# Patient Record
Sex: Male | Born: 1945 | Race: White | Hispanic: No | Marital: Married | Smoking: Never smoker
Health system: Southern US, Community
[De-identification: ages and names within clinical notes are randomized; demographics above are authoritative.]

## PROBLEM LIST (undated history)

## (undated) DIAGNOSIS — I8393 Asymptomatic varicose veins of bilateral lower extremities: Secondary | ICD-10-CM

## (undated) DIAGNOSIS — I2510 Atherosclerotic heart disease of native coronary artery without angina pectoris: Secondary | ICD-10-CM

## (undated) DIAGNOSIS — I441 Atrioventricular block, second degree: Secondary | ICD-10-CM

## (undated) DIAGNOSIS — I214 Non-ST elevation (NSTEMI) myocardial infarction: Secondary | ICD-10-CM

## (undated) DIAGNOSIS — F341 Dysthymic disorder: Secondary | ICD-10-CM

## (undated) HISTORY — PX: HERNIA REPAIR: SHX51

## (undated) HISTORY — DX: Dysthymic disorder: F34.1

---

## 1998-12-25 HISTORY — PX: OTHER SURGICAL HISTORY: SHX169

## 1999-06-15 ENCOUNTER — Encounter: Payer: Self-pay | Admitting: Vascular Surgery

## 1999-06-17 ENCOUNTER — Ambulatory Visit (HOSPITAL_COMMUNITY): Admission: RE | Admit: 1999-06-17 | Discharge: 1999-06-17 | Payer: Self-pay | Admitting: Vascular Surgery

## 1999-12-31 ENCOUNTER — Ambulatory Visit (HOSPITAL_COMMUNITY): Admission: RE | Admit: 1999-12-31 | Discharge: 1999-12-31 | Payer: Self-pay | Admitting: Family Medicine

## 1999-12-31 ENCOUNTER — Encounter: Payer: Self-pay | Admitting: Family Medicine

## 2013-09-02 DIAGNOSIS — H6590 Unspecified nonsuppurative otitis media, unspecified ear: Secondary | ICD-10-CM | POA: Diagnosis not present

## 2013-09-02 DIAGNOSIS — Z23 Encounter for immunization: Secondary | ICD-10-CM | POA: Diagnosis not present

## 2013-09-30 DIAGNOSIS — H109 Unspecified conjunctivitis: Secondary | ICD-10-CM | POA: Diagnosis not present

## 2013-09-30 DIAGNOSIS — B368 Other specified superficial mycoses: Secondary | ICD-10-CM | POA: Diagnosis not present

## 2013-11-27 DIAGNOSIS — B3784 Candidal otitis externa: Secondary | ICD-10-CM | POA: Diagnosis not present

## 2013-12-03 DIAGNOSIS — B3784 Candidal otitis externa: Secondary | ICD-10-CM | POA: Diagnosis not present

## 2014-01-14 DIAGNOSIS — B3784 Candidal otitis externa: Secondary | ICD-10-CM | POA: Diagnosis not present

## 2014-02-13 DIAGNOSIS — J111 Influenza due to unidentified influenza virus with other respiratory manifestations: Secondary | ICD-10-CM | POA: Diagnosis not present

## 2014-02-13 DIAGNOSIS — R030 Elevated blood-pressure reading, without diagnosis of hypertension: Secondary | ICD-10-CM | POA: Diagnosis not present

## 2014-05-27 DIAGNOSIS — I998 Other disorder of circulatory system: Secondary | ICD-10-CM | POA: Diagnosis not present

## 2014-06-08 DIAGNOSIS — Z0000 Encounter for general adult medical examination without abnormal findings: Secondary | ICD-10-CM | POA: Diagnosis not present

## 2014-06-08 DIAGNOSIS — Z125 Encounter for screening for malignant neoplasm of prostate: Secondary | ICD-10-CM | POA: Diagnosis not present

## 2014-06-08 DIAGNOSIS — Z131 Encounter for screening for diabetes mellitus: Secondary | ICD-10-CM | POA: Diagnosis not present

## 2014-07-02 ENCOUNTER — Other Ambulatory Visit: Payer: Self-pay | Admitting: Family Medicine

## 2014-07-02 DIAGNOSIS — Z0000 Encounter for general adult medical examination without abnormal findings: Secondary | ICD-10-CM

## 2014-08-05 ENCOUNTER — Encounter (INDEPENDENT_AMBULATORY_CARE_PROVIDER_SITE_OTHER): Payer: Self-pay

## 2014-08-05 ENCOUNTER — Ambulatory Visit
Admission: RE | Admit: 2014-08-05 | Discharge: 2014-08-05 | Disposition: A | Discharge status: Home / Self Care | Payer: Medicare Other | Source: Ambulatory Visit | Attending: Family Medicine | Admitting: Family Medicine

## 2014-08-05 DIAGNOSIS — Z0000 Encounter for general adult medical examination without abnormal findings: Secondary | ICD-10-CM

## 2014-08-05 DIAGNOSIS — I77819 Aortic ectasia, unspecified site: Secondary | ICD-10-CM | POA: Diagnosis not present

## 2014-08-05 DIAGNOSIS — Z136 Encounter for screening for cardiovascular disorders: Secondary | ICD-10-CM | POA: Diagnosis not present

## 2014-10-09 ENCOUNTER — Other Ambulatory Visit: Payer: Self-pay

## 2014-11-30 DIAGNOSIS — J0100 Acute maxillary sinusitis, unspecified: Secondary | ICD-10-CM | POA: Diagnosis not present

## 2015-02-04 DIAGNOSIS — F430 Acute stress reaction: Secondary | ICD-10-CM | POA: Diagnosis not present

## 2015-02-04 DIAGNOSIS — R21 Rash and other nonspecific skin eruption: Secondary | ICD-10-CM | POA: Diagnosis not present

## 2015-02-04 DIAGNOSIS — R0683 Snoring: Secondary | ICD-10-CM | POA: Diagnosis not present

## 2015-06-21 ENCOUNTER — Other Ambulatory Visit: Payer: Self-pay

## 2015-07-07 DIAGNOSIS — L820 Inflamed seborrheic keratosis: Secondary | ICD-10-CM | POA: Diagnosis not present

## 2015-07-07 DIAGNOSIS — N5201 Erectile dysfunction due to arterial insufficiency: Secondary | ICD-10-CM | POA: Diagnosis not present

## 2015-07-07 DIAGNOSIS — Z0001 Encounter for general adult medical examination with abnormal findings: Secondary | ICD-10-CM | POA: Diagnosis not present

## 2015-07-07 DIAGNOSIS — Z125 Encounter for screening for malignant neoplasm of prostate: Secondary | ICD-10-CM | POA: Diagnosis not present

## 2015-07-07 DIAGNOSIS — Z23 Encounter for immunization: Secondary | ICD-10-CM | POA: Diagnosis not present

## 2015-07-07 DIAGNOSIS — I868 Varicose veins of other specified sites: Secondary | ICD-10-CM | POA: Diagnosis not present

## 2015-07-07 DIAGNOSIS — D485 Neoplasm of uncertain behavior of skin: Secondary | ICD-10-CM | POA: Diagnosis not present

## 2015-07-07 DIAGNOSIS — Z131 Encounter for screening for diabetes mellitus: Secondary | ICD-10-CM | POA: Diagnosis not present

## 2015-07-07 DIAGNOSIS — F430 Acute stress reaction: Secondary | ICD-10-CM | POA: Diagnosis not present

## 2015-09-20 DIAGNOSIS — J310 Chronic rhinitis: Secondary | ICD-10-CM | POA: Diagnosis not present

## 2015-09-20 DIAGNOSIS — J029 Acute pharyngitis, unspecified: Secondary | ICD-10-CM | POA: Diagnosis not present

## 2015-09-20 DIAGNOSIS — R05 Cough: Secondary | ICD-10-CM | POA: Diagnosis not present

## 2015-12-15 DIAGNOSIS — Z23 Encounter for immunization: Secondary | ICD-10-CM | POA: Diagnosis not present

## 2016-02-11 IMAGING — US US AORTA SCREENING (MEDICARE)
1 series · 13 of 13 positions shown · non-contrast
Comparison: None.

CLINICAL DATA: Medicare screening exam for abdominal aortic
aneurysm.

EXAM:
ABDOMINAL AORTA SCREENING ULTRASOUND
TECHNIQUE: Ultrasound examination of the abdominal aorta was performed as a
screening evaluation for abdominal aortic aneurysm.

[Series 1: us aorta screening (medicare) · 0.28mm/px · 13 of 13 slices shown]
[im 1/13]
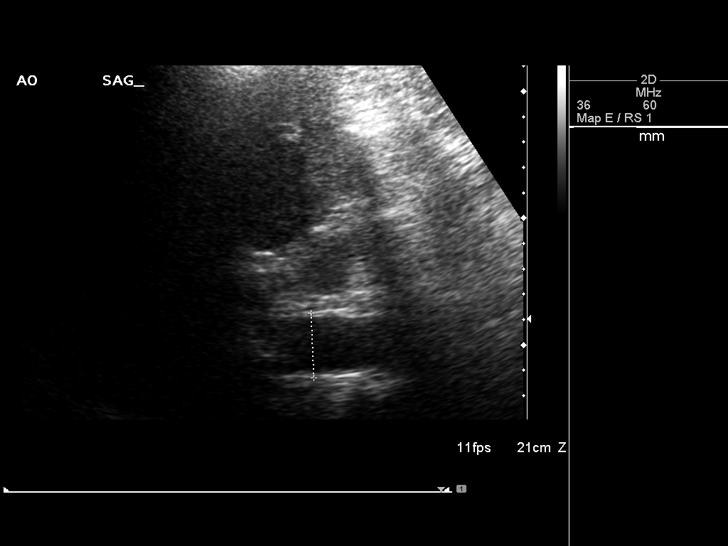
[im 2/13]
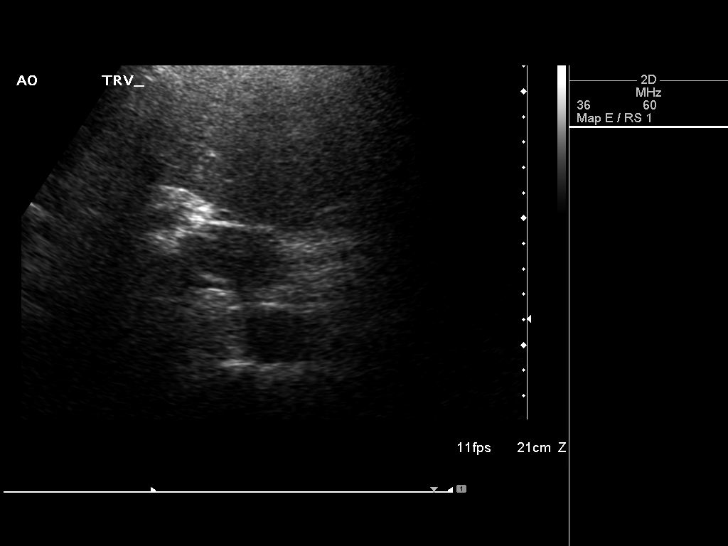
[im 3/13]
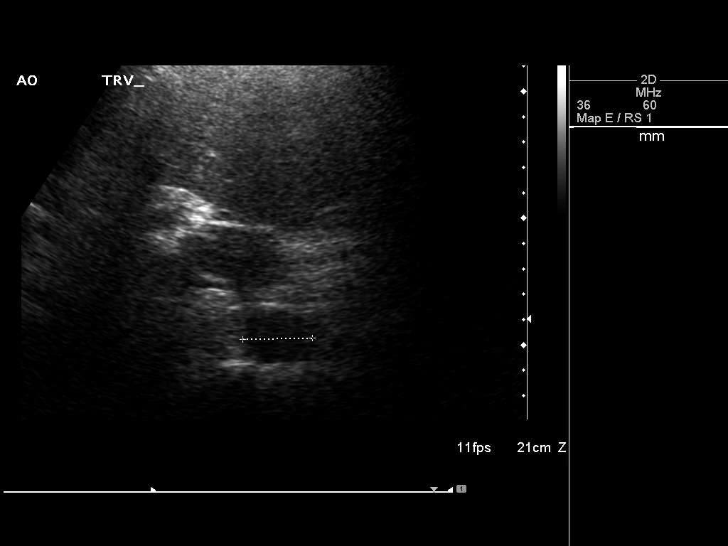
[im 4/13]
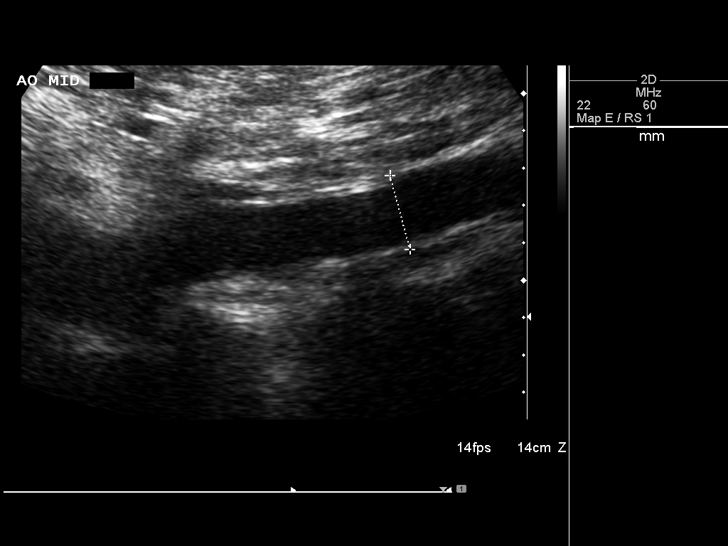
[im 5/13]
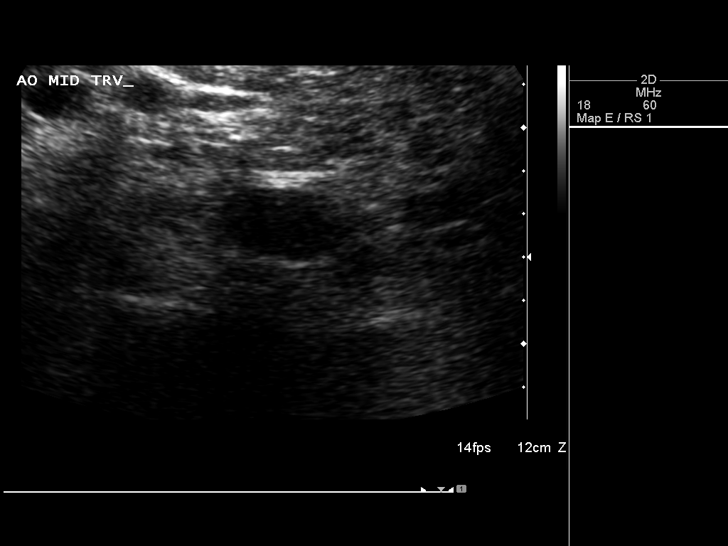
[im 6/13]
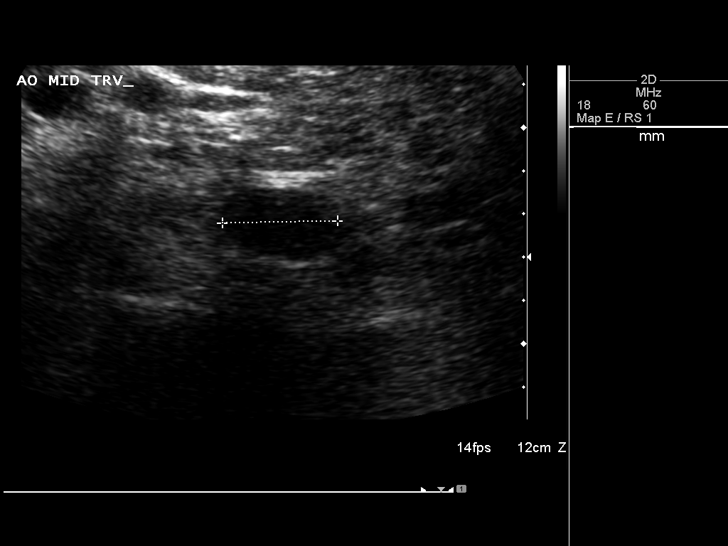
[im 7/13]
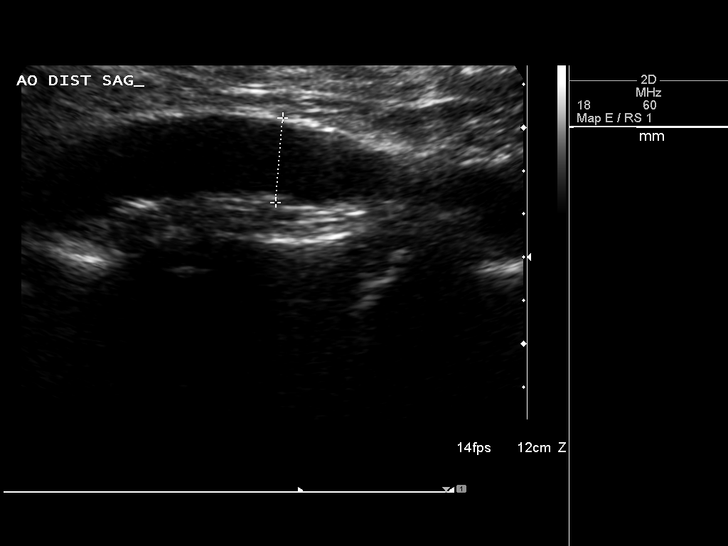
[im 8/13]
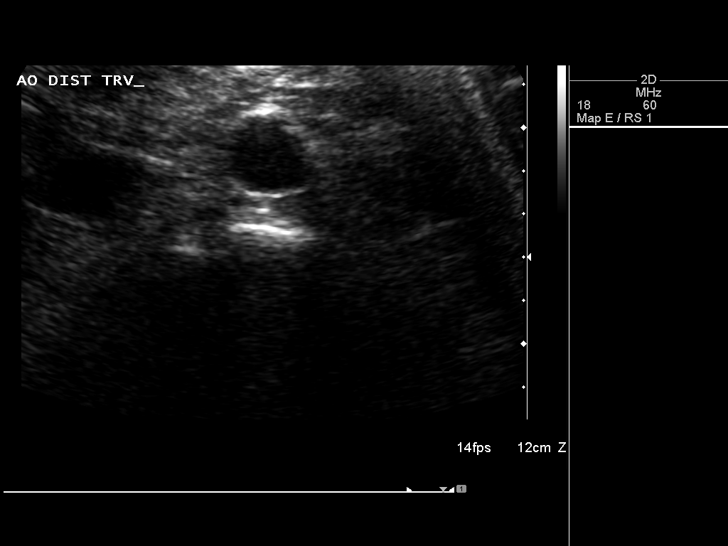
[im 9/13]
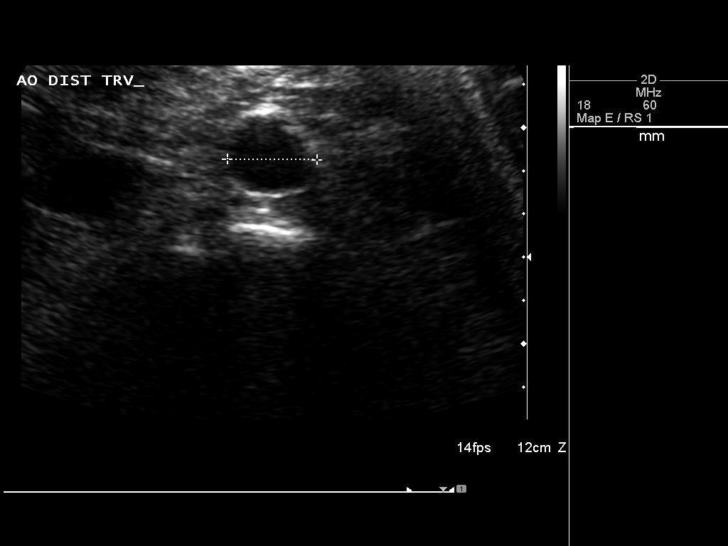
[im 10/13]
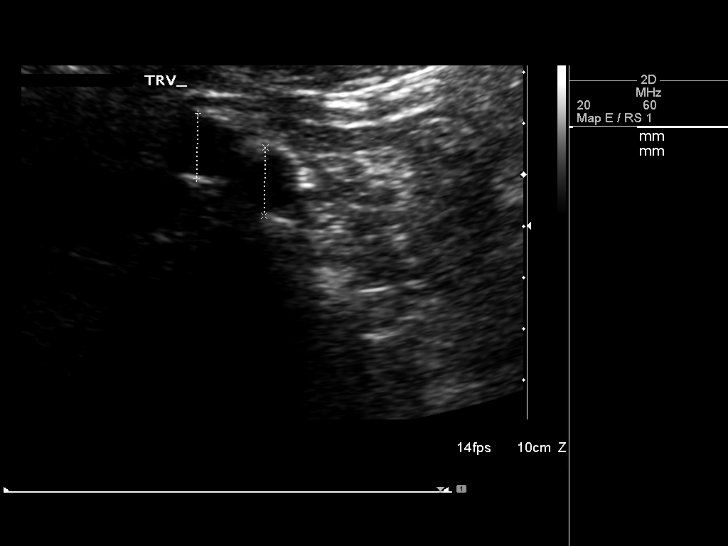
[im 11/13]
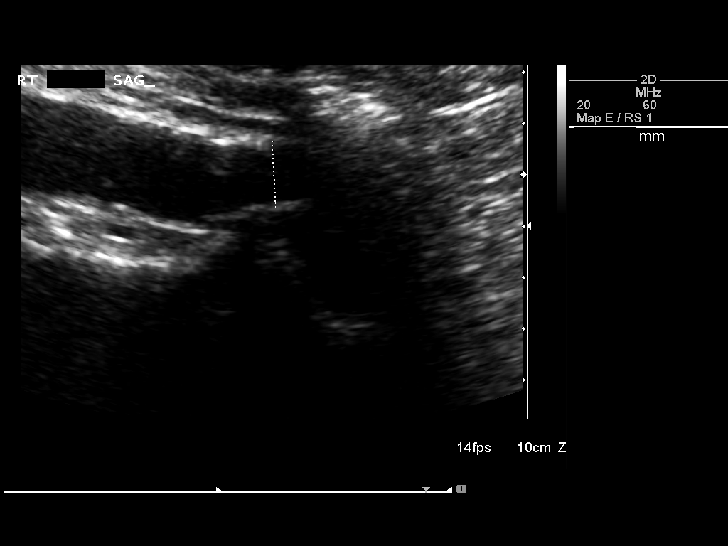
[im 12/13]
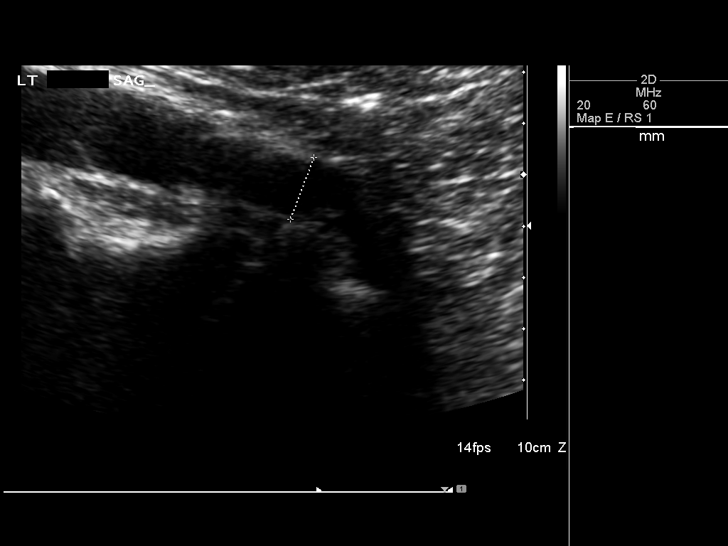
[im 13/13]
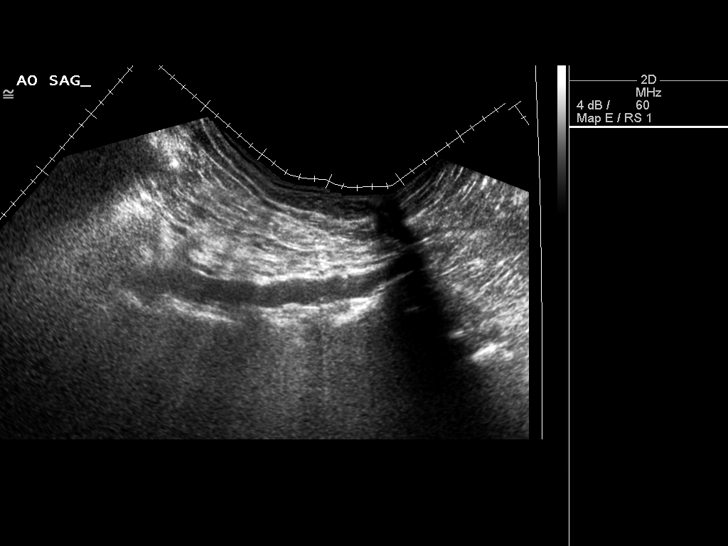

[13 of 13 positions shown; findings below may reference images not displayed]

FINDINGS: Abdominal Aorta

No aneurysm identified.

Maximum AP

Diameter:  2.6 cm

Maximum TRV

Diameter: 2.8 cm
IMPRESSION: Ectatic abdominal aorta at risk for aneurysm development. Recommend
follow up by US in 5 years. This recommendation follows ACR
consensus guidelines: White Paper of the ACR Incidental Findings
Committee II on Vascular Findings. [HOSPITAL] 4883;

## 2016-07-25 DIAGNOSIS — N529 Male erectile dysfunction, unspecified: Secondary | ICD-10-CM | POA: Diagnosis not present

## 2016-07-25 DIAGNOSIS — Z0000 Encounter for general adult medical examination without abnormal findings: Secondary | ICD-10-CM | POA: Diagnosis not present

## 2016-07-25 DIAGNOSIS — I8393 Asymptomatic varicose veins of bilateral lower extremities: Secondary | ICD-10-CM | POA: Diagnosis not present

## 2016-07-25 DIAGNOSIS — Z125 Encounter for screening for malignant neoplasm of prostate: Secondary | ICD-10-CM | POA: Diagnosis not present

## 2016-07-25 DIAGNOSIS — Z131 Encounter for screening for diabetes mellitus: Secondary | ICD-10-CM | POA: Diagnosis not present

## 2016-07-25 DIAGNOSIS — I499 Cardiac arrhythmia, unspecified: Secondary | ICD-10-CM | POA: Diagnosis not present

## 2016-08-18 DIAGNOSIS — I83813 Varicose veins of bilateral lower extremities with pain: Secondary | ICD-10-CM | POA: Diagnosis not present

## 2016-08-18 DIAGNOSIS — M7989 Other specified soft tissue disorders: Secondary | ICD-10-CM | POA: Diagnosis not present

## 2016-08-18 DIAGNOSIS — M79606 Pain in leg, unspecified: Secondary | ICD-10-CM | POA: Diagnosis not present

## 2016-08-18 DIAGNOSIS — R252 Cramp and spasm: Secondary | ICD-10-CM | POA: Diagnosis not present

## 2016-09-12 ENCOUNTER — Other Ambulatory Visit: Payer: Self-pay | Admitting: *Deleted

## 2016-09-12 DIAGNOSIS — I83893 Varicose veins of bilateral lower extremities with other complications: Secondary | ICD-10-CM

## 2016-09-18 ENCOUNTER — Encounter: Payer: Self-pay | Admitting: Cardiology

## 2016-09-18 ENCOUNTER — Ambulatory Visit (INDEPENDENT_AMBULATORY_CARE_PROVIDER_SITE_OTHER): Payer: Medicare Other | Admitting: Cardiology

## 2016-09-18 VITALS — BP 120/80 | HR 90 | Ht 74.0 in | Wt 233.0 lb

## 2016-09-18 DIAGNOSIS — I868 Varicose veins of other specified sites: Secondary | ICD-10-CM | POA: Diagnosis not present

## 2016-09-18 DIAGNOSIS — I441 Atrioventricular block, second degree: Secondary | ICD-10-CM | POA: Diagnosis not present

## 2016-09-18 DIAGNOSIS — R9431 Abnormal electrocardiogram [ECG] [EKG]: Secondary | ICD-10-CM

## 2016-09-18 DIAGNOSIS — I4510 Unspecified right bundle-branch block: Secondary | ICD-10-CM | POA: Diagnosis not present

## 2016-09-18 DIAGNOSIS — I8390 Asymptomatic varicose veins of unspecified lower extremity: Secondary | ICD-10-CM

## 2016-09-18 NOTE — Progress Notes (Signed)
Cardiology Office Note    Date:  09/18/2016   ID:  Matthew Nunez, DOB 1946-06-11, MRN [id number]  PCP:  Harle Battiest, MD  Cardiologist:   Candee Furbish, MD     History of Present Illness:  Matthew Nunez is a 70 y.o. male here for evaluation of second-degree heart block, irregular rhythm at the request of Dr. Melinda Crutch. He had an EKG performed on 07/25/16 as part of the Medicare physical which showed second-degree heart block. He is not describing any particular symptoms with this, no shortness of breath, no chest pain, no orthopnea, no PND.  In the past, he has had leg vein stripping. Has Pro long leg swelling left leg. Varicose veins will be examined on 10/08/16 by Dr. Donnetta Hutching. He previously seen somebody in [place] where his daughter is currently residing.  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 Enjoying this.    Past Medical History:  Diagnosis Date  . Dysthymia     Past Surgical History:  Procedure Laterality Date  . HERNIA REPAIR    . VERICOSE VEIN SURGERY  2000    Current Medications: Outpatient Medications Prior to Visit  Medication Sig Dispense Refill  . aspirin EC 81 MG tablet Take 81 mg by mouth daily.    Marland Kitchen SILDENAFIL CITRATE PO TAKE 2-5 TABLETS BY MOUTH  ONCE A DAY     No facility-administered medications prior to visit.      Allergies:   Review of patient's allergies indicates no known allergies.   Social History   Social History  . Marital status: Married    Spouse name: N/A  . Number of children: N/A  . Years of education: N/A   Social History Main Topics  . Smoking status: Never Smoker  . Smokeless tobacco: Never Used  . Alcohol use Yes     Comment: 2 DRINKS A WEEK  . Drug use: No  . Sexual activity: Yes     Comment: MARRIED   Other Topics Concern  . None   Social History Narrative  . None     Family History:  The patient's family history includes Alzheimer's disease in his father; Cirrhosis in his  brother; Healthy in his sister.   ROS:   Please see the history of present illness.    ROS All other systems reviewed and are negative.   PHYSICAL EXAM:   VS:  BP 120/80   Pulse 90   Ht 6\' 2"  (1.88 m)   Wt 233 lb (105.7 kg)   BMI 29.92 kg/m    GEN: Well nourished, well developed, in no acute distress  HEENT: normal  Neck: no JVD, carotid bruits, or masses Cardiac: RRR; no murmurs, rubs, or gallops,no edema  Respiratory:  clear to auscultation bilaterally, normal work of breathing GI: soft, nontender, nondistended, + BS MS: no deformity or atrophy  Skin: warm and dry, no rash Neuro:  Alert and Oriented x 3, Strength and sensation are intact Psych: euthymic mood, full affect  Wt Readings from Last 3 Encounters:  09/18/16 233 lb (105.7 kg)      Studies/Labs Reviewed:   EKG:  07/25/16-prior EKG shows sinus rhythm, right bundle branch block, prolongation of PR interval with subsequent drop consistent with second-degree heart block type I, Wenkebach.   Recent Labs: No results found for requested labs within last 8760 hours.   Lipid Panel No results found for: CHOL, TRIG, HDL, CHOLHDL, VLDL, LDLCALC, LDLDIRECT  Additional studies/ records that were  reviewed today include:  Prior records, labs, office notes, EKGs reviewed.    ASSESSMENT:    1. Abnormal EKG   2. Right bundle branch block   3. Second-degree heart block   4. 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  - Avoid beta blockers and calcium channel blockers.  - No high-risk symptoms such as syncope.  - Explained the physiology.  - This lesion does not require pacemaker.  - If symptoms of shortness of breath, lethargy, syncope, significant bradycardia occur, he may require pacemaker in the future.  - We will keep an eye on him. See him in one year.  Right bundle branch block  - Fibrosis.  - Checking echocardiogram.  -  Conduction abnormalities noted as above. No symptoms.  Varicose veins  - Hereditary. Previously seen Dr. at [facility].  - Has a consultation with Dr. Donnetta Hutching.    Medication Adjustments/Labs and Tests Ordered: Current medicines are reviewed at length with the patient today.  Concerns regarding medicines are outlined above.  Medication changes, Labs and Tests ordered today are listed in the Patient Instructions below. Patient Instructions  Medication Instructions:  The current medical regimen is effective;  continue present plan and medications.  Testing/Procedures: Your physician has requested that you have an echocardiogram. Echocardiography is a painless test that uses sound waves to create images of your heart. It provides your doctor with information about the size and shape of your heart and how well your heart's chambers and valves are working. This procedure takes approximately one hour. There are no restrictions for this procedure.  Follow-Up: Follow up in 1 year with Dr. Marlou Porch.  You will receive a letter in the mail 2 months before you are due.  Please call us when you receive this letter to schedule your follow up appointment.  If you need a refill on your cardiac medications before your next appointment, please call your pharmacy.  Thank you for choosing [facility]!!        Signed, Candee Furbish, MD  09/18/2016 10:34 AM    [facility] [address], [place], [place]  [address] Phone: [phone]; Fax: [phone]

## 2016-09-18 NOTE — Patient Instructions (Signed)

## 2016-09-19 ENCOUNTER — Ambulatory Visit (HOSPITAL_COMMUNITY): Payer: Medicare Other | Attending: Cardiology

## 2016-09-19 ENCOUNTER — Encounter (INDEPENDENT_AMBULATORY_CARE_PROVIDER_SITE_OTHER): Payer: Self-pay

## 2016-09-19 ENCOUNTER — Other Ambulatory Visit: Payer: Self-pay

## 2016-09-19 DIAGNOSIS — I517 Cardiomegaly: Secondary | ICD-10-CM | POA: Insufficient documentation

## 2016-09-19 DIAGNOSIS — I509 Heart failure, unspecified: Secondary | ICD-10-CM | POA: Diagnosis not present

## 2016-09-19 DIAGNOSIS — R9431 Abnormal electrocardiogram [ECG] [EKG]: Secondary | ICD-10-CM | POA: Diagnosis not present

## 2016-09-19 DIAGNOSIS — I340 Nonrheumatic mitral (valve) insufficiency: Secondary | ICD-10-CM | POA: Diagnosis not present

## 2016-10-05 ENCOUNTER — Encounter: Payer: Self-pay | Admitting: Vascular Surgery

## 2016-10-11 ENCOUNTER — Encounter: Payer: Self-pay | Admitting: Vascular Surgery

## 2016-10-11 ENCOUNTER — Ambulatory Visit (INDEPENDENT_AMBULATORY_CARE_PROVIDER_SITE_OTHER): Payer: Medicare Other | Admitting: Vascular Surgery

## 2016-10-11 ENCOUNTER — Ambulatory Visit (HOSPITAL_COMMUNITY)
Admission: RE | Admit: 2016-10-11 | Discharge: 2016-10-11 | Disposition: A | Discharge status: Home / Self Care | Payer: Medicare Other | Source: Ambulatory Visit | Attending: Vascular Surgery | Admitting: Vascular Surgery

## 2016-10-11 VITALS — BP 137/83 | HR 90 | Temp 98.4°F | Resp 18 | Ht 74.0 in | Wt 230.0 lb

## 2016-10-11 DIAGNOSIS — I82812 Embolism and thrombosis of superficial veins of left lower extremities: Secondary | ICD-10-CM | POA: Insufficient documentation

## 2016-10-11 DIAGNOSIS — I83893 Varicose veins of bilateral lower extremities with other complications: Secondary | ICD-10-CM

## 2016-10-11 DIAGNOSIS — R609 Edema, unspecified: Secondary | ICD-10-CM | POA: Diagnosis present

## 2016-10-11 NOTE — Progress Notes (Signed)
Vascular and Vein Specialist of [place]  Patient name: Matthew Nunez MRN: [id number] DOB: 04-Apr-1946 Sex: male  REASON FOR CONSULT: Evaluation of bilateral lower extremity venous pathology  HPI: Matthew Nunez is a 70 y.o. male, who is in today for evaluation of bilateral lower extremity venous pathology. He is well known to me from ligation stripping of his left great saphenous vein 17 years ago. He is done well since that time. He did have a recurrence of varicosities throughout the medial aspect of his thigh extending into his calf on the left. His had slowly progressed over time. He noted approximately 2 months ago that these became quite firm and mildly tender. He does report some discomfort associated with these large varices prior to his thromboses. He does have some swelling and mild discomfort in his right leg.  Past Medical History:  Diagnosis Date  . Dysthymia     Family History  Problem Relation Age of Onset  . Alzheimer's disease Father   . Healthy Sister   . Cirrhosis Brother     SOCIAL HISTORY: Social History   Social History  . Marital status: Married    Spouse name: N/A  . Number of children: N/A  . Years of education: N/A   Occupational History  . Not on file.   Social History Main Topics  . Smoking status: Never Smoker  . Smokeless tobacco: Never Used  . Alcohol use Yes     Comment: 2 DRINKS A WEEK  . Drug use: No  . Sexual activity: Yes     Comment: MARRIED   Other Topics Concern  . Not on file   Social History Narrative  . No narrative on file    No Known Allergies  Current Outpatient Prescriptions  Medication Sig Dispense Refill  . aspirin EC 81 MG tablet Take 81 mg by mouth daily.    Marland Kitchen SILDENAFIL CITRATE PO TAKE 2-5 TABLETS BY MOUTH  ONCE A DAY    . amoxicillin (AMOXIL) 500 MG capsule     . HYDROcodone-acetaminophen (NORCO) 10-325 MG tablet      No current facility-administered  medications for this visit.     REVIEW OF SYSTEMS:  [X]  denotes positive finding, [ ]  denotes negative finding Cardiac  Comments:  Chest pain or chest pressure:    Shortness of breath upon exertion:    Short of breath when lying flat:    Irregular heart rhythm:        Vascular    Pain in calf, thigh, or hip brought on by ambulation:    Pain in feet at night that wakes you up from your sleep:     Blood clot in your veins:    Leg swelling:  x       Pulmonary    Oxygen at home:    Productive cough:     Wheezing:         Neurologic    Sudden weakness in arms or legs:     Sudden numbness in arms or legs:     Sudden onset of difficulty speaking or slurred speech:    Temporary loss of vision in one eye:     Problems with dizziness:         Gastrointestinal    Blood in stool:     Vomited blood:         Genitourinary    Burning when urinating:     Blood in urine:  Psychiatric    Major depression:         Hematologic    Bleeding problems:    Problems with blood clotting too easily:        Skin    Rashes or ulcers:        Constitutional    Fever or chills:      PHYSICAL EXAM: Vitals:   10/11/16 1332  BP: 137/83  Pulse: 90  Resp: 18  Temp: 98.4 F (36.9 C)  SpO2: 96%  Weight: 230 lb (104.3 kg)  Height: 6\' 2"  (1.88 m)    GENERAL: The patient is a well-nourished male, in no acute distress. The vital signs are documented above. CARDIOVASCULAR: 2+ dorsalis pedis pulses bilaterally PULMONARY: There is good air exchange  ABDOMEN: Soft and non-tender  MUSCULOSKELETAL: There are no major deformities or cyanosis. NEUROLOGIC: No focal weakness or paresthesias are detected. SKIN: There are no ulcers or rashes noted. PSYCHIATRIC: The patient has a normal affect. He does have obvious clot in the varicosities in his medial thigh. He has large varicosities in his medial calf on the left which are not thrombosed DATA:  Lower extremity bilateral venous duplex today  was reviewed with the patient. This shows her to call absence of his great saphenous vein on the left. He does have an anterior accessory branch arising from his groin and extends into the varicosities in his thigh and calf. There is thrombosis throughout the anterior accessory saphenous vein and varicosities on the left. On the right he has a duplicated system with some reflux in the anterior accessory branch on the right. These veins come together just below the level of knee and is somewhat dilated with reflux below this.  MEDICAL ISSUES: Long discussion with patient and his wife present. Explained there is no significant risk for DVT or pulmonary embolus related to this. Plan at with the thrombus in the left anterior saphenous vein and varicosities that there is no treatment possible currently. Explained that this may or resolve and continue to have venous reflux through this or he may sclerose these veins and require no further treatment. We'll see him again in 3 months for continued observation and evaluation. He will continue wearing his compression garments in the interim. Regarding his right leg, I do not see any indication for treatment since he is having mild symptoms only. We will see him again in 3 months   Rosetta Posner, MD [facility] Vascular and Vein Specialists of [facility] Tel [phone] Pager [phone]

## 2016-11-07 DIAGNOSIS — J069 Acute upper respiratory infection, unspecified: Secondary | ICD-10-CM | POA: Diagnosis not present

## 2016-11-07 DIAGNOSIS — Z23 Encounter for immunization: Secondary | ICD-10-CM | POA: Diagnosis not present

## 2016-11-07 DIAGNOSIS — R05 Cough: Secondary | ICD-10-CM | POA: Diagnosis not present

## 2016-11-29 DIAGNOSIS — R05 Cough: Secondary | ICD-10-CM | POA: Diagnosis not present

## 2017-01-16 ENCOUNTER — Ambulatory Visit: Payer: Medicare Other | Admitting: Vascular Surgery

## 2017-01-23 ENCOUNTER — Encounter: Payer: Self-pay | Admitting: Vascular Surgery

## 2017-01-30 ENCOUNTER — Encounter: Payer: Self-pay | Admitting: Vascular Surgery

## 2017-01-30 ENCOUNTER — Ambulatory Visit: Payer: Medicare Other | Admitting: Vascular Surgery

## 2017-02-06 ENCOUNTER — Ambulatory Visit (INDEPENDENT_AMBULATORY_CARE_PROVIDER_SITE_OTHER): Payer: Medicare Other | Admitting: Vascular Surgery

## 2017-02-06 ENCOUNTER — Encounter: Payer: Self-pay | Admitting: Vascular Surgery

## 2017-02-06 VITALS — BP 127/77 | HR 66 | Temp 98.1°F | Resp 18 | Ht 72.5 in | Wt 230.2 lb

## 2017-02-06 DIAGNOSIS — I83893 Varicose veins of bilateral lower extremities with other complications: Secondary | ICD-10-CM | POA: Diagnosis not present

## 2017-02-06 NOTE — Progress Notes (Signed)
Vascular and Vein Specialist of [place]  Patient name: Matthew Nunez MRN: [id number] DOB: 1946-02-29 Sex: male  REASON FOR VISIT: I'll up of left leg superficial thrombophlebitis  HPI: Matthew Nunez is a 71 y.o. male here today for follow-up. Well-known to me from a prior vein stripping of his left great saphenous vein approximate 18 years ago. I'd seen him in October where he had had superficial thrombophlebitis and recurrent varices in his medial thigh and medial calf. Duplex that time revealed reflux through an anterior accessory branch of his great saphenous vein. I recommended conservative treatment to allow resolution of this. He does have some reflux in his right leg with some tributary varicosities but no symptoms related to this. I recommended observation only. He is seen today to assure resolution of his superficial thrombophlebitis and discuss treatment options as needed  Past Medical History:  Diagnosis Date  . Dysthymia     Family History  Problem Relation Age of Onset  . Alzheimer's disease Father   . Healthy Sister   . Cirrhosis Brother     SOCIAL HISTORY: Social History  Substance Use Topics  . Smoking status: Never Smoker  . Smokeless tobacco: Never Used  . Alcohol use Yes     Comment: 2 DRINKS A WEEK    No Known Allergies  Current Outpatient Prescriptions  Medication Sig Dispense Refill  . aspirin EC 81 MG tablet Take 81 mg by mouth daily.    Marland Kitchen SILDENAFIL CITRATE PO TAKE 2-5 TABLETS BY MOUTH  ONCE A DAY    . amoxicillin (AMOXIL) 500 MG capsule     . HYDROcodone-acetaminophen (NORCO) 10-325 MG tablet      No current facility-administered medications for this visit.     REVIEW OF SYSTEMS:  [X]  denotes positive finding, [ ]  denotes negative finding Cardiac  Comments:  Chest pain or chest pressure:    Shortness of breath upon exertion:    Short of breath when lying flat:    Irregular heart rhythm:      Vascular    Pain in calf, thigh, or hip brought on by ambulation:    Pain in feet at night that wakes you up from your sleep:     Blood clot in your veins: x Superficial thrombophlebitis   Leg swelling:           PHYSICAL EXAM: Vitals:   02/06/17 0954  BP: 127/77  Pulse: 66  Resp: 18  Temp: 98.1 F (36.7 C)  TempSrc: Oral  SpO2: 96%  Weight: 230 lb 3.2 oz (104.4 kg)  Height: 6' 0.5" (1.842 m)    GENERAL: The patient is a well-nourished male, in no acute distress. The vital signs are documented above. CARDIOVASCULAR: 2+ dorsalis pedis pulses bilaterally. Scattered varicosities in both legs. This is resolving superficial thrombus in his medial thigh and the small varicosities in his medial calf PULMONARY: There is good air exchange  MUSCULOSKELETAL: There are no major deformities or cyanosis. NEUROLOGIC: No focal weakness or paresthesias are detected. SKIN: There are no ulcers or rashes noted. No changes of venous hypertension PSYCHIATRIC: The patient has a normal affect.  DATA:  I reimage these areas on his left leg with SonoSite ultrasound. This does show resolving thrombus in the superficial veins.  MEDICAL ISSUES: Again had discussion with patient regarding options. He had seen an outlying [place] at the time of his initial presentation and recommended bilateral surgical treatment. I certainly recommended against this since he  is not having any significant symptoms and should have no risk related to this. He is a comfortable with conservative treatment and will see [place] again on an as-needed basis    Rosetta Posner, MD [facility] Vascular and Vein Specialists of [facility] Tel [phone] Pager [phone]

## 2017-09-04 DIAGNOSIS — Z0000 Encounter for general adult medical examination without abnormal findings: Secondary | ICD-10-CM | POA: Diagnosis not present

## 2017-09-04 DIAGNOSIS — Z125 Encounter for screening for malignant neoplasm of prostate: Secondary | ICD-10-CM | POA: Diagnosis not present

## 2017-09-04 DIAGNOSIS — I8393 Asymptomatic varicose veins of bilateral lower extremities: Secondary | ICD-10-CM | POA: Diagnosis not present

## 2017-09-04 DIAGNOSIS — Z131 Encounter for screening for diabetes mellitus: Secondary | ICD-10-CM | POA: Diagnosis not present

## 2017-09-04 DIAGNOSIS — Z23 Encounter for immunization: Secondary | ICD-10-CM | POA: Diagnosis not present

## 2018-04-29 ENCOUNTER — Encounter (HOSPITAL_COMMUNITY): Payer: Self-pay

## 2018-04-29 ENCOUNTER — Emergency Department (HOSPITAL_COMMUNITY): Payer: Medicare Other

## 2018-04-29 ENCOUNTER — Other Ambulatory Visit: Payer: Self-pay

## 2018-04-29 ENCOUNTER — Inpatient Hospital Stay (HOSPITAL_COMMUNITY)
Admission: EM | Admit: 2018-04-29 | Discharge: 2018-05-01 | DRG: 247 | Disposition: A | Discharge status: Home / Self Care | Payer: Medicare Other | Attending: Interventional Cardiology | Admitting: Interventional Cardiology

## 2018-04-29 DIAGNOSIS — Z955 Presence of coronary angioplasty implant and graft: Secondary | ICD-10-CM

## 2018-04-29 DIAGNOSIS — E785 Hyperlipidemia, unspecified: Secondary | ICD-10-CM | POA: Diagnosis not present

## 2018-04-29 DIAGNOSIS — Z8379 Family history of other diseases of the digestive system: Secondary | ICD-10-CM

## 2018-04-29 DIAGNOSIS — I214 Non-ST elevation (NSTEMI) myocardial infarction: Principal | ICD-10-CM

## 2018-04-29 DIAGNOSIS — Z820 Family history of epilepsy and other diseases of the nervous system: Secondary | ICD-10-CM

## 2018-04-29 DIAGNOSIS — R030 Elevated blood-pressure reading, without diagnosis of hypertension: Secondary | ICD-10-CM | POA: Diagnosis not present

## 2018-04-29 DIAGNOSIS — I8390 Asymptomatic varicose veins of unspecified lower extremity: Secondary | ICD-10-CM | POA: Diagnosis present

## 2018-04-29 DIAGNOSIS — I441 Atrioventricular block, second degree: Secondary | ICD-10-CM | POA: Diagnosis not present

## 2018-04-29 DIAGNOSIS — Z791 Long term (current) use of non-steroidal anti-inflammatories (NSAID): Secondary | ICD-10-CM

## 2018-04-29 DIAGNOSIS — R079 Chest pain, unspecified: Secondary | ICD-10-CM | POA: Diagnosis not present

## 2018-04-29 DIAGNOSIS — I2510 Atherosclerotic heart disease of native coronary artery without angina pectoris: Secondary | ICD-10-CM | POA: Diagnosis present

## 2018-04-29 DIAGNOSIS — Z7982 Long term (current) use of aspirin: Secondary | ICD-10-CM

## 2018-04-29 DIAGNOSIS — R0789 Other chest pain: Secondary | ICD-10-CM | POA: Diagnosis not present

## 2018-04-29 DIAGNOSIS — R002 Palpitations: Secondary | ICD-10-CM | POA: Diagnosis not present

## 2018-04-29 DIAGNOSIS — I517 Cardiomegaly: Secondary | ICD-10-CM | POA: Diagnosis not present

## 2018-04-29 DIAGNOSIS — I4510 Unspecified right bundle-branch block: Secondary | ICD-10-CM | POA: Diagnosis not present

## 2018-04-29 DIAGNOSIS — Z79899 Other long term (current) drug therapy: Secondary | ICD-10-CM

## 2018-04-29 DIAGNOSIS — I872 Venous insufficiency (chronic) (peripheral): Secondary | ICD-10-CM | POA: Diagnosis present

## 2018-04-29 DIAGNOSIS — R5383 Other fatigue: Secondary | ICD-10-CM | POA: Diagnosis not present

## 2018-04-29 HISTORY — DX: Atherosclerotic heart disease of native coronary artery without angina pectoris: I25.10

## 2018-04-29 HISTORY — DX: Asymptomatic varicose veins of bilateral lower extremities: I83.93

## 2018-04-29 HISTORY — DX: Non-ST elevation (NSTEMI) myocardial infarction: I21.4

## 2018-04-29 HISTORY — DX: Atrioventricular block, second degree: I44.1

## 2018-04-29 LAB — BASIC METABOLIC PANEL
ANION GAP: 10 (ref 5–15)
BUN: 18 mg/dL (ref 6–20)
CHLORIDE: 100 mmol/L — AB (ref 101–111)
CO2: 26 mmol/L (ref 22–32)
CREATININE: 1.09 mg/dL (ref 0.61–1.24)
Calcium: 8.7 mg/dL — ABNORMAL LOW (ref 8.9–10.3)
GFR calc non Af Amer: >60 mL/min (ref >60)
GLUCOSE: 137 mg/dL — AB (ref 65–99)
Potassium: 4.1 mmol/L (ref 3.5–5.1)
Sodium: 136 mmol/L (ref 135–145)

## 2018-04-29 LAB — I-STAT TROPONIN, ED: Troponin i, poc: 0.44 ng/mL (ref 0.00–0.08)

## 2018-04-29 LAB — CBC
HCT: 41 % (ref 39.0–52.0)
HEMOGLOBIN: 14.4 g/dL (ref 13.0–17.0)
MCH: 31.2 pg (ref 26.0–34.0)
MCHC: 35.1 g/dL (ref 30.0–36.0)
MCV: 88.7 fL (ref 78.0–100.0)
Platelets: 209 10*3/uL (ref 150–400)
RBC: 4.62 MIL/uL (ref 4.22–5.81)
RDW: 12.9 % (ref 11.5–15.5)
WBC: 7.7 10*3/uL (ref 4.0–10.5)

## 2018-04-29 LAB — TROPONIN I: TROPONIN I: 0.71 ng/mL — AB (ref <0.03)

## 2018-04-29 MED ORDER — ASPIRIN 81 MG PO CHEW: 324.0000 mg | CHEWABLE_TABLET | Freq: Once | ORAL | Status: DC

## 2018-04-29 MED ORDER — HEPARIN BOLUS VIA INFUSION
4000.0000 [IU] | Freq: Once | INTRAVENOUS | Status: AC
  Administered 2018-04-30: 4000 [IU] via INTRAVENOUS
  Filled 2018-04-29: qty 4000

## 2018-04-29 MED ORDER — HEPARIN (PORCINE) IN NACL 100-0.45 UNIT/ML-% IJ SOLN
1200.0000 [IU]/h | INTRAMUSCULAR | Status: DC
  Administered 2018-04-30: 1200 [IU]/h via INTRAVENOUS
  Filled 2018-04-29: qty 250

## 2018-04-29 NOTE — H&P (Signed)
PCP:  Lawerance Cruel, MD  PCP-Cardiology: Marlou Porch   Reason for Admission: NSTEMi   HPI:    We were asked by Dr. Gilford Raid to see this 72 yo patient for suspected NSTEMI.  He had an evaluation by Dr. Marlou Porch in 2017 for RBBB and type 1 second degree AV block.  Echo at that time showed EF 55-60% with moderate LV hypertrophy.  He had no further cardiac issues until this past Saturday.  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  This was not particularly bothersome.  Later in the day, he noted chest heaviness again walking up a hill with his dog.  On Sunday, he noted heaviness in chest and shortness of breath walking up a hill with his dog.  He did not have any symptoms today, but was seen by his PCP this afternoon for evaluation of his symptoms from the weekend.  Troponin was drawn, and he was called and told that it was positive and that he needed to go to the ER.   In the ER, he is chest pain free.  No complaints at this time.  TnI in ER was 0.44 => 0.71.  ECG shows RBBB, which is chronic.   Patient has no history of smoking and no FH of CAD.  He does not have a history of HTN, hyperlipidemia, or DM.   Review of Systems: All systems reviewed and negative except as per HPI.   Home Medications Prior to Admission medications   Medication Sig Start Date End Date Taking? Authorizing Provider  acetaminophen (TYLENOL) 325 MG tablet Take 325-650 mg by mouth every 6 (six) hours as needed for mild pain or headache.   Yes [provider]  aspirin EC 81 MG tablet Take 324 mg by mouth once as needed (for sudden onset of chest pain).    Yes [provider]  ibuprofen (ADVIL,MOTRIN) 200 MG tablet Take 200-400 mg by mouth every 6 (six) hours as needed for headache or mild pain.   Yes [provider]  loratadine (CLARITIN) 10 MG tablet Take 10 mg by mouth daily.   Yes [provider]  Magnesium  300 MG CAPS Take 300 mg by mouth daily.   Yes [provider]    Past Medical History: 1. H/o RBBB 2. 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 Echo in 9/17 with EF 55-60%, moderate LVH, mild MR.  4. Venous insufficiency.   Past Surgical History: Past Surgical History:  Procedure Laterality Date  . HERNIA REPAIR    . VERICOSE VEIN SURGERY  2000    Family History:  Family History  Problem Relation Age of Onset  . Alzheimer's disease Father   . Healthy Sister   . Cirrhosis Brother     Social History: Social History   Socioeconomic History  . Marital status: Married    Spouse name: Not on file  . Number of children: Not on file  . Years of education: Not on file  . Highest education level: Not on file  Occupational History  . Not on file  Social Needs  . Financial resource strain: Not on file  . Food insecurity:    Worry: Not on file    Inability: Not on file  . Transportation needs:    Medical: Not on file    Non-medical: Not on file  Tobacco Use  . Smoking  status: Never Smoker  . Smokeless tobacco: Never Used  Substance and Sexual Activity  . Alcohol use: Yes    Comment: 2 DRINKS A WEEK  . Drug use: No  . Sexual activity: Yes    Comment: MARRIED  Lifestyle  . Physical activity:    Days per week: Not on file    Minutes per session: Not on file  . Stress: Not on file  Relationships  . Social connections:    Talks on phone: Not on file    Gets together: Not on file    Attends religious service: Not on file    Active member of club or organization: Not on file    Attends meetings of clubs or organizations: Not on file    Relationship status: Not on file  Other Topics Concern  . Not on file  Social History Narrative  . Not on file    Allergies:  No Known Allergies  Objective:    Vital Signs:   Temp:  [98.8 F (37.1 C)] 98.8 F (37.1 C) (05/06 2158) Pulse Rate:  [41-88] 86 (05/06 2300) Resp:  [14-20] 19 (05/06 2300) BP: (132-168)/(75-93)  168/93 (05/06 2300) SpO2:  [90 %-98 %] 97 % (05/06 2300) Weight:  [238 lb (108 kg)] 238 lb (108 kg) (05/06 2158)   Filed Weights   04/29/18 2158  Weight: 238 lb (108 kg)     Physical Exam     General:  Well appearing. No respiratory difficulty HEENT: Normal Neck: Supple. no JVD. Carotids 2+ bilat; no bruits. No lymphadenopathy or thyromegaly appreciated. Cor: PMI nondisplaced. Regular rate & rhythm. No rubs, gallops.  1/6 HSM LLSB. Lungs: Clear Abdomen: Soft, nontender, nondistended. No hepatosplenomegaly. No bruits or masses. Good bowel sounds. Extremities: No cyanosis, clubbing, rash, edema Neuro: Alert & oriented x 3, cranial nerves grossly intact. moves all 4 extremities w/o difficulty. Affect pleasant.   EKG   NSR, RBBB (personally reviewed)  Labs     Basic Metabolic Panel: No results for input(s): NA, K, CL, CO2, GLUCOSE, BUN, CREATININE, CALCIUM, MG, PHOS in the last 168 hours.  Liver Function Tests: No results for input(s): AST, ALT, ALKPHOS, BILITOT, PROT, ALBUMIN in the last 168 hours. No results for input(s): LIPASE, AMYLASE in the last 168 hours. No results for input(s): AMMONIA in the last 168 hours.  CBC: Recent Labs  Lab 04/29/18 2226  WBC 7.7  HGB 14.4  HCT 41.0  MCV 88.7  PLT 209    Cardiac Enzymes: No results for input(s): CKTOTAL, CKMB, CKMBINDEX, TROPONINI in the last 168 hours.  BNP: BNP (last 3 results) No results for input(s): BNP in the last 8760 hours.  ProBNP (last 3 results) No results for input(s): PROBNP in the last 8760 hours.   CBG: No results for input(s): GLUCAP in the last 168 hours.  Coagulation Studies: No results for input(s): LABPROT, INR in the last 72 hours.  Imaging: Dg Chest 2 View  Result Date: 04/29/2018 CLINICAL DATA:  72 year old male with chest pain. EXAM: CHEST - 2 VIEW COMPARISON:  None. FINDINGS: The lungs are clear. There is no pleural effusion or pneumothorax. The cardiac silhouette is within normal  limits. Lower thoracic dextroscoliosis. No acute osseous pathology. IMPRESSION: No active cardiopulmonary disease. Electronically Signed   By: Anner Crete M.D.   On: 04/29/2018 22:59       Assessment/Plan   1. CAD: NSTEMI.  Symptoms were never marked but began on Saturday with some additional chest/arm heaviness on Sunday.  Asymptomatic today.  Troponin mildly elevated at 0.71, ECG with chronic RBBB.  - Cycle troponin to peak.  - Heparin gtt, ASA, statin.  - Will give metoprolol 12.5 mg every 8 hrs.  - He will need coronary angiography tomorrow.  I discussed risks/benefits with him and he agrees to proceed.  2. H/o type 1 2nd degree AVB: This has not been seen in the hospital so far.  Monitor on telemetry and careful with beta blocker (starting low dose).   Loralie Champagne, MD 04/29/2018, 11:21 PM  Advanced Heart Failure Team Pager [phone] (M-F; [place])  Please contact [facility] for night-coverage after hours (4p -7a ) and weekends on [email or web address]

## 2018-04-29 NOTE — ED Triage Notes (Signed)
Pt to ED via [facility].  Pt st's he was feeling fatigue and was seen by his MD and had labs.drawn.  Pt st's later he was walking his dog and had chest pressure radiating across chest and into left shoulder.  Pt st's pain subsided with rest.  Pt st's his MD called and told him that his enzymes were elevated and that he needed to come to ED.  Pt denies any pain or complaints at this time

## 2018-04-29 NOTE — ED Provider Notes (Signed)
[facility] EMERGENCY DEPARTMENT Provider Note   CSN: [id number] Arrival date & time: 04/29/18  2153     History   Chief Complaint Chief Complaint  Patient presents with  . Chest Pain  . Abnormal Lab    HPI Matthew Nunez is a 72 y.o. male.  The history is provided by the patient and medical records.    72 y.o. M with hx of RBBB, heart block, varicose veins, presenting to the ED at recommendation of PCP for abnormal labs.  Patient states over the weekend he was not quite feeling like himself-- very fatigued and somewhat "off".  States Saturday was the worse, he actually went to after hours clinic but they were closed.  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  States this time he started getting some very mild chest discomfort and was SOB.  He did not have any dizziness, diaphoresis, nausea, vomiting, or weakness.  States he went back home and rested and felt ok, a little pain in his arms but nothing majorly concerning.  He went to his PCP today and had labs drawn and was told his cardiac enzymes were elevated and to get to the ED ASAP.  He did take 325mg  ASA PTA.  Patient has no known hx of CAD.  No prior cardiac cath.  He has never been a smoker.  Patient is asymptomatic at this time.  Past Medical History:  Diagnosis Date  . Dysthymia     Patient Active Problem List   Diagnosis Date Noted  . Right bundle branch block 09/18/2016  . Second-degree heart block 09/18/2016  . Varicose veins 09/18/2016    Past Surgical History:  Procedure Laterality Date  . [place] Medications    Prior to Admission medications   Medication Sig Start Date End Date Taking? Authorizing Provider  amoxicillin (AMOXIL) 500 MG capsule  09/21/16   [provider]  aspirin EC 81 MG tablet Take 81  mg by mouth daily.    [provider]  HYDROcodone-acetaminophen [facility]) 10-325 MG tablet  09/21/16   [provider]  SILDENAFIL CITRATE PO TAKE 2-5 TABLETS BY MOUTH  ONCE A DAY    [provider]    Family History Family History  Problem Relation Age of Onset  . Alzheimer's disease Father   . Healthy Sister   . Cirrhosis Brother     Social History Social History   Tobacco Use  . Smoking status: Never Smoker  . Smokeless tobacco: Never Used  Substance Use Topics  . Alcohol use: Yes    Comment: 2 DRINKS A WEEK  . Drug use: No     Allergies   Patient has no known allergies.   Review of Systems Review of Systems  Cardiovascular: Positive for chest pain.  All other systems reviewed and are negative.    Physical Exam Updated Vital Signs BP (!) 143/88 (BP Location: Right Arm)   Pulse 88   Temp 98.8 F (37.1 C) (Oral)   Resp 20   Ht 6\' 2"  (1.88 m)   Wt 108 kg (238 lb)   SpO2 98%   BMI 30.56 kg/m   Physical Exam  Constitutional: He is oriented to person, place, and time. He appears well-developed and  well-nourished.  HENT:  Head: Normocephalic and atraumatic.  Mouth/Throat: Oropharynx is clear and moist.  Eyes: Pupils are equal, round, and reactive to light. Conjunctivae and EOM are normal.  Neck: Normal range of motion.  Cardiovascular: Normal rate, regular rhythm and normal heart sounds.  Pulmonary/Chest: Effort normal and breath sounds normal. No accessory muscle usage. No tachypnea. He has no decreased breath sounds. He has no wheezes.  Abdominal: Soft. Bowel sounds are normal.  Musculoskeletal: Normal range of motion.  Neurological: He is alert and oriented to person, place, and time.  Skin: Skin is warm and dry.  Psychiatric: He has a normal mood and affect.  Nursing note and vitals reviewed.    ED Treatments / Results  Labs (all labs ordered are listed, but only abnormal results are displayed) Labs Reviewed  BASIC  METABOLIC PANEL - Abnormal; Notable for the following components:      Result Value   Chloride 100 (*)    Glucose, Bld 137 (*)    Calcium 8.7 (*)    All other components within normal limits  TROPONIN I - Abnormal; Notable for the following components:   Troponin I 0.71 (*)    All other components within normal limits  I-STAT TROPONIN, ED - Abnormal; Notable for the following components:   Troponin i, poc 0.44 (*)    All other components within normal limits  CBC  TROPONIN I  TROPONIN I  TROPONIN I  HEPARIN LEVEL (UNFRACTIONATED)    EKG EKG Interpretation  Date/Time:  Monday Apr 29 2018 21:58:40 EDT Ventricular Rate:  89 PR Interval:    QRS Duration: 153 QT Interval:  402 QTC Calculation: 490 R Axis:   -4 Text Interpretation:  Sinus or ectopic atrial rhythm Right bundle branch block Inferior infarct, old No old tracing to compare Confirmed by Isla Pence ([id number]) on 04/29/2018 11:22:23 PM   Radiology Dg Chest 2 View  Result Date: 04/29/2018 CLINICAL DATA:  72 year old male with chest pain. EXAM: CHEST - 2 VIEW COMPARISON:  None. FINDINGS: The lungs are clear. There is no pleural effusion or pneumothorax. The cardiac silhouette is within normal limits. Lower thoracic dextroscoliosis. No acute osseous pathology. IMPRESSION: No active cardiopulmonary disease. Electronically Signed   By: Anner Crete M.D.   On: 04/29/2018 22:59    Procedures Procedures (including critical care time)  CRITICAL CARE Performed by: Larene Pickett   Total critical care time: 35 minutes  Critical care time was exclusive of separately billable procedures and treating other patients.  Critical care was necessary to treat or prevent imminent or life-threatening deterioration.  Critical care was time spent personally by me on the following activities: development of treatment plan with patient and/or surrogate as well as nursing, discussions with consultants, evaluation of patient's response  to treatment, examination of patient, obtaining history from patient or surrogate, ordering and performing treatments and interventions, ordering and review of laboratory studies, ordering and review of radiographic studies, pulse oximetry and re-evaluation of patient's condition.   Medications Ordered in ED Medications - No data to display   Initial Impression / Assessment and Plan / ED Course  I have reviewed the triage vital signs and the nursing notes.  Pertinent labs & imaging results that were available during my care of the patient were reviewed by me and considered in my medical decision making (see chart for details).  72 year old male here with episode of chest pain that occurred yesterday while walking dog and elevated cardiac enzymes from PCP office  earlier today.  Full dose ASA prior to arrival.  He is asymptomatic here.  EKG without any acute ischemic changes.  Troponin here remains elevated.  Clinically NSTEMI.  Patient has no hx of CAD, no prior cardiac cath but likely will need one this admission.  Heparin started.  Discussed with Dr. Aundra Dubin-- will admit for ongoing care.  Final Clinical Impressions(s) / ED Diagnoses   Final diagnoses:  NSTEMI (non-ST elevated myocardial infarction) [facility])    ED Discharge Orders    None       Larene Pickett, PA-C 04/30/18 0026    Isla Pence, MD 04/30/18 1731

## 2018-04-30 ENCOUNTER — Encounter (HOSPITAL_COMMUNITY): Payer: Self-pay | Admitting: Cardiology

## 2018-04-30 ENCOUNTER — Inpatient Hospital Stay (HOSPITAL_COMMUNITY)
Admission: EM | Disposition: A | Discharge status: Home / Self Care | Payer: Self-pay | Source: Home / Self Care | Attending: Cardiology

## 2018-04-30 ENCOUNTER — Other Ambulatory Visit (HOSPITAL_COMMUNITY): Payer: Medicare Other

## 2018-04-30 DIAGNOSIS — I872 Venous insufficiency (chronic) (peripheral): Secondary | ICD-10-CM | POA: Diagnosis present

## 2018-04-30 DIAGNOSIS — Z820 Family history of epilepsy and other diseases of the nervous system: Secondary | ICD-10-CM | POA: Diagnosis not present

## 2018-04-30 DIAGNOSIS — I214 Non-ST elevation (NSTEMI) myocardial infarction: Secondary | ICD-10-CM | POA: Diagnosis present

## 2018-04-30 DIAGNOSIS — I252 Old myocardial infarction: Secondary | ICD-10-CM | POA: Diagnosis present

## 2018-04-30 DIAGNOSIS — Z791 Long term (current) use of non-steroidal anti-inflammatories (NSAID): Secondary | ICD-10-CM | POA: Diagnosis not present

## 2018-04-30 DIAGNOSIS — Z7982 Long term (current) use of aspirin: Secondary | ICD-10-CM | POA: Diagnosis not present

## 2018-04-30 DIAGNOSIS — E782 Mixed hyperlipidemia: Secondary | ICD-10-CM | POA: Diagnosis not present

## 2018-04-30 DIAGNOSIS — I4510 Unspecified right bundle-branch block: Secondary | ICD-10-CM | POA: Diagnosis present

## 2018-04-30 DIAGNOSIS — I8390 Asymptomatic varicose veins of unspecified lower extremity: Secondary | ICD-10-CM | POA: Diagnosis present

## 2018-04-30 DIAGNOSIS — I517 Cardiomegaly: Secondary | ICD-10-CM | POA: Diagnosis present

## 2018-04-30 DIAGNOSIS — Z8379 Family history of other diseases of the digestive system: Secondary | ICD-10-CM | POA: Diagnosis not present

## 2018-04-30 DIAGNOSIS — I340 Nonrheumatic mitral (valve) insufficiency: Secondary | ICD-10-CM | POA: Diagnosis not present

## 2018-04-30 DIAGNOSIS — Z79899 Other long term (current) drug therapy: Secondary | ICD-10-CM | POA: Diagnosis not present

## 2018-04-30 DIAGNOSIS — I441 Atrioventricular block, second degree: Secondary | ICD-10-CM | POA: Diagnosis present

## 2018-04-30 DIAGNOSIS — E785 Hyperlipidemia, unspecified: Secondary | ICD-10-CM | POA: Diagnosis present

## 2018-04-30 DIAGNOSIS — I2510 Atherosclerotic heart disease of native coronary artery without angina pectoris: Secondary | ICD-10-CM | POA: Diagnosis not present

## 2018-04-30 HISTORY — PX: LEFT HEART CATH AND CORONARY ANGIOGRAPHY: CATH118249

## 2018-04-30 HISTORY — PX: CORONARY STENT INTERVENTION: CATH118234

## 2018-04-30 LAB — BASIC METABOLIC PANEL
ANION GAP: 8 (ref 5–15)
BUN: 18 mg/dL (ref 6–20)
CHLORIDE: 102 mmol/L (ref 101–111)
CO2: 26 mmol/L (ref 22–32)
Calcium: 8.7 mg/dL — ABNORMAL LOW (ref 8.9–10.3)
Creatinine, Ser: 0.96 mg/dL (ref 0.61–1.24)
GFR calc Af Amer: >60 mL/min (ref >60)
GFR calc non Af Amer: >60 mL/min (ref >60)
GLUCOSE: 96 mg/dL (ref 65–99)
POTASSIUM: 3.8 mmol/L (ref 3.5–5.1)
Sodium: 136 mmol/L (ref 135–145)

## 2018-04-30 LAB — TROPONIN I
Troponin I: 0.66 ng/mL (ref <0.03)
Troponin I: 0.76 ng/mL (ref <0.03)
Troponin I: 1.03 ng/mL (ref <0.03)

## 2018-04-30 LAB — CBC WITH DIFFERENTIAL/PLATELET
Basophils Absolute: 0.1 10*3/uL (ref 0.0–0.1)
Basophils Relative: 1 %
Eosinophils Absolute: 0.4 10*3/uL (ref 0.0–0.7)
Eosinophils Relative: 5 %
HEMATOCRIT: 40.5 % (ref 39.0–52.0)
HEMOGLOBIN: 14 g/dL (ref 13.0–17.0)
LYMPHS PCT: 37 %
Lymphs Abs: 2.8 10*3/uL (ref 0.7–4.0)
MCH: 30.8 pg (ref 26.0–34.0)
MCHC: 34.6 g/dL (ref 30.0–36.0)
MCV: 89 fL (ref 78.0–100.0)
MONO ABS: 0.6 10*3/uL (ref 0.1–1.0)
MONOS PCT: 8 %
NEUTROS ABS: 3.7 10*3/uL (ref 1.7–7.7)
NEUTROS PCT: 49 %
Platelets: 196 10*3/uL (ref 150–400)
RBC: 4.55 MIL/uL (ref 4.22–5.81)
RDW: 12.8 % (ref 11.5–15.5)
WBC: 7.6 10*3/uL (ref 4.0–10.5)

## 2018-04-30 SURGERY — LEFT HEART CATH AND CORONARY ANGIOGRAPHY
Anesthesia: LOCAL

## 2018-04-30 MED ORDER — LABETALOL HCL 5 MG/ML IV SOLN
10.0000 mg | INTRAVENOUS | Status: AC | PRN
  Administered 2018-04-30: 10 mg via INTRAVENOUS

## 2018-04-30 MED ORDER — HEPARIN SODIUM (PORCINE) 1000 UNIT/ML IJ SOLN
INTRAMUSCULAR | Status: AC
  Filled 2018-04-30: qty 1

## 2018-04-30 MED ORDER — MIDAZOLAM HCL 2 MG/2ML IJ SOLN
INTRAMUSCULAR | Status: DC | PRN
  Administered 2018-04-30: 2 mg via INTRAVENOUS
  Administered 2018-04-30: 1 mg via INTRAVENOUS

## 2018-04-30 MED ORDER — SODIUM CHLORIDE 0.9% FLUSH
3.0000 mL | Freq: Two times a day (BID) | INTRAVENOUS | Status: DC
  Administered 2018-04-30: 21:00:00 3 mL via INTRAVENOUS

## 2018-04-30 MED ORDER — SODIUM CHLORIDE 0.9 % WEIGHT BASED INFUSION: 3.0000 mL/kg/h | INTRAVENOUS | Status: DC

## 2018-04-30 MED ORDER — ACETAMINOPHEN 325 MG PO TABS: 650.0000 mg | ORAL_TABLET | ORAL | Status: DC | PRN

## 2018-04-30 MED ORDER — ASPIRIN 81 MG PO CHEW: 81.0000 mg | CHEWABLE_TABLET | ORAL | Status: DC

## 2018-04-30 MED ORDER — MIDAZOLAM HCL 2 MG/2ML IJ SOLN
INTRAMUSCULAR | Status: AC
  Filled 2018-04-30: qty 2

## 2018-04-30 MED ORDER — SODIUM CHLORIDE 0.9 % WEIGHT BASED INFUSION: 1.0000 mL/kg/h | INTRAVENOUS | Status: DC

## 2018-04-30 MED ORDER — TICAGRELOR 90 MG PO TABS
ORAL_TABLET | ORAL | Status: AC
  Filled 2018-04-30: qty 2

## 2018-04-30 MED ORDER — NITROGLYCERIN 1 MG/10 ML FOR IR/CATH LAB
INTRA_ARTERIAL | Status: DC | PRN
  Administered 2018-04-30: 200 ug via INTRACORONARY

## 2018-04-30 MED ORDER — SODIUM CHLORIDE 0.9% FLUSH: 3.0000 mL | Freq: Two times a day (BID) | INTRAVENOUS | Status: DC

## 2018-04-30 MED ORDER — SODIUM CHLORIDE 0.9 % WEIGHT BASED INFUSION
1.0000 mL/kg/h | INTRAVENOUS | Status: DC
  Administered 2018-04-30: 1 mL/kg/h via INTRAVENOUS

## 2018-04-30 MED ORDER — ASPIRIN 81 MG PO CHEW
81.0000 mg | CHEWABLE_TABLET | ORAL | Status: AC
  Administered 2018-04-30: 81 mg via ORAL
  Filled 2018-04-30: qty 1

## 2018-04-30 MED ORDER — FENTANYL CITRATE (PF) 100 MCG/2ML IJ SOLN
INTRAMUSCULAR | Status: AC
  Filled 2018-04-30: qty 2

## 2018-04-30 MED ORDER — LABETALOL HCL 5 MG/ML IV SOLN
INTRAVENOUS | Status: AC
  Filled 2018-04-30: qty 4

## 2018-04-30 MED ORDER — VERAPAMIL HCL 2.5 MG/ML IV SOLN
INTRAVENOUS | Status: AC
  Filled 2018-04-30: qty 2

## 2018-04-30 MED ORDER — HEPARIN (PORCINE) IN NACL 2-0.9 UNITS/ML
INTRAMUSCULAR | Status: AC | PRN
  Administered 2018-04-30 (×2): 500 mL

## 2018-04-30 MED ORDER — ONDANSETRON HCL 4 MG/2ML IJ SOLN: 4.0000 mg | Freq: Four times a day (QID) | INTRAMUSCULAR | Status: DC | PRN

## 2018-04-30 MED ORDER — HEPARIN (PORCINE) IN NACL 1000-0.9 UT/500ML-% IV SOLN
INTRAVENOUS | Status: AC
  Filled 2018-04-30: qty 1000

## 2018-04-30 MED ORDER — ACETAMINOPHEN 325 MG PO TABS: 325.0000 mg | ORAL_TABLET | Freq: Four times a day (QID) | ORAL | Status: DC | PRN

## 2018-04-30 MED ORDER — LORATADINE 10 MG PO TABS
10.0000 mg | ORAL_TABLET | Freq: Every day | ORAL | Status: DC
  Filled 2018-04-30: qty 1

## 2018-04-30 MED ORDER — IOHEXOL 350 MG/ML SOLN
INTRAVENOUS | Status: DC | PRN
  Administered 2018-04-30: 200 mL

## 2018-04-30 MED ORDER — NITROGLYCERIN 0.4 MG SL SUBL: 0.4000 mg | SUBLINGUAL_TABLET | SUBLINGUAL | Status: DC | PRN

## 2018-04-30 MED ORDER — ASPIRIN 300 MG RE SUPP: 300.0000 mg | RECTAL | Status: AC

## 2018-04-30 MED ORDER — METOPROLOL TARTRATE 12.5 MG HALF TABLET
12.5000 mg | ORAL_TABLET | Freq: Three times a day (TID) | ORAL | Status: DC
  Administered 2018-04-30 – 2018-05-01 (×5): 12.5 mg via ORAL
  Filled 2018-04-30 (×4): qty 1

## 2018-04-30 MED ORDER — TICAGRELOR 90 MG PO TABS
90.0000 mg | ORAL_TABLET | Freq: Two times a day (BID) | ORAL | Status: DC
Start: 2018-04-30 — End: 2018-05-01
  Administered 2018-04-30 – 2018-05-01 (×2): 90 mg via ORAL
  Filled 2018-04-30 (×2): qty 1

## 2018-04-30 MED ORDER — LIDOCAINE HCL (PF) 1 % IJ SOLN
INTRAMUSCULAR | Status: DC | PRN
  Administered 2018-04-30: 2 mL

## 2018-04-30 MED ORDER — SODIUM CHLORIDE 0.9% FLUSH: 3.0000 mL | INTRAVENOUS | Status: DC | PRN

## 2018-04-30 MED ORDER — LIDOCAINE HCL (PF) 1 % IJ SOLN
INTRAMUSCULAR | Status: AC
  Filled 2018-04-30: qty 30

## 2018-04-30 MED ORDER — HEPARIN SODIUM (PORCINE) 1000 UNIT/ML IJ SOLN
INTRAMUSCULAR | Status: DC | PRN
  Administered 2018-04-30: 3000 [IU] via INTRAVENOUS
  Administered 2018-04-30: 5000 [IU] via INTRAVENOUS
  Administered 2018-04-30: 2000 [IU] via INTRAVENOUS
  Administered 2018-04-30: 4000 [IU] via INTRAVENOUS

## 2018-04-30 MED ORDER — HYDRALAZINE HCL 20 MG/ML IJ SOLN
INTRAMUSCULAR | Status: DC | PRN
  Administered 2018-04-30: 10 mg via INTRAVENOUS

## 2018-04-30 MED ORDER — ASPIRIN 81 MG PO CHEW: 324.0000 mg | CHEWABLE_TABLET | ORAL | Status: AC

## 2018-04-30 MED ORDER — ATORVASTATIN CALCIUM 80 MG PO TABS
80.0000 mg | ORAL_TABLET | Freq: Every day | ORAL | Status: DC
  Administered 2018-04-30: 19:00:00 80 mg via ORAL
  Filled 2018-04-30: qty 1

## 2018-04-30 MED ORDER — SODIUM CHLORIDE 0.9 % IV SOLN: 250.0000 mL | INTRAVENOUS | Status: DC | PRN

## 2018-04-30 MED ORDER — SODIUM CHLORIDE 0.9 % IV SOLN: INTRAVENOUS | Status: AC

## 2018-04-30 MED ORDER — ASPIRIN EC 81 MG PO TBEC
81.0000 mg | DELAYED_RELEASE_TABLET | Freq: Every day | ORAL | Status: DC
  Administered 2018-05-01: 09:00:00 81 mg via ORAL
  Filled 2018-04-30: qty 1

## 2018-04-30 MED ORDER — VERAPAMIL HCL 2.5 MG/ML IV SOLN
INTRAVENOUS | Status: DC | PRN
  Administered 2018-04-30: 10 mL via INTRA_ARTERIAL

## 2018-04-30 MED ORDER — HYDRALAZINE HCL 20 MG/ML IJ SOLN
INTRAMUSCULAR | Status: AC
  Filled 2018-04-30: qty 1

## 2018-04-30 MED ORDER — TICAGRELOR 90 MG PO TABS
ORAL_TABLET | ORAL | Status: DC | PRN
  Administered 2018-04-30: 180 mg via ORAL

## 2018-04-30 MED ORDER — FENTANYL CITRATE (PF) 100 MCG/2ML IJ SOLN
INTRAMUSCULAR | Status: DC | PRN
  Administered 2018-04-30: 25 ug via INTRAVENOUS
  Administered 2018-04-30 (×2): 50 ug via INTRAVENOUS

## 2018-04-30 MED ORDER — SODIUM CHLORIDE 0.9 % WEIGHT BASED INFUSION
3.0000 mL/kg/h | INTRAVENOUS | Status: DC
  Administered 2018-04-30: 3 mL/kg/h via INTRAVENOUS

## 2018-04-30 MED ORDER — ANGIOPLASTY BOOK
Freq: Once | Status: AC
  Administered 2018-05-01: 07:00:00
  Filled 2018-04-30: qty 1

## 2018-04-30 SURGICAL SUPPLY — 21 items
BALLN SAPPHIRE 2.0X12 (BALLOONS) ×2
BALLN SAPPHIRE 2.5X15 (BALLOONS) ×2
BALLN ~~LOC~~ EMERGE MR 2.75X8 (BALLOONS) ×2
BALLOON SAPPHIRE 2.0X12 (BALLOONS) IMPLANT
BALLOON SAPPHIRE 2.5X15 (BALLOONS) IMPLANT
BALLOON ~~LOC~~ EMERGE MR 2.75X8 (BALLOONS) IMPLANT
CATH INFINITI 5 FR JL3.5 (CATHETERS) ×1 IMPLANT
CATH INFINITI 5FR ANG PIGTAIL (CATHETERS) ×1 IMPLANT
CATH LAUNCHER 6FR JR4 (CATHETERS) ×1 IMPLANT
CATH OPTITORQUE TIG 4.0 5F (CATHETERS) ×1 IMPLANT
DEVICE RAD COMP TR BAND LRG (VASCULAR PRODUCTS) ×1 IMPLANT
GLIDESHEATH SLEND A-KIT 6F 22G (SHEATH) ×1 IMPLANT
GUIDEWIRE INQWIRE 1.5J.035X260 (WIRE) IMPLANT
INQWIRE 1.5J .035X260CM (WIRE) ×2
KIT ENCORE 26 ADVANTAGE (KITS) ×1 IMPLANT
KIT HEART LEFT (KITS) ×2 IMPLANT
PACK CARDIAC CATHETERIZATION (CUSTOM PROCEDURE TRAY) ×2 IMPLANT
STENT SYNERGY DES 2.75X24 (Permanent Stent) ×1 IMPLANT
TRANSDUCER W/STOPCOCK (MISCELLANEOUS) ×2 IMPLANT
TUBING CIL FLEX 10 FLL-RA (TUBING) ×2 IMPLANT
WIRE ASAHI PROWATER 180CM (WIRE) ×1 IMPLANT

## 2018-04-30 NOTE — Progress Notes (Addendum)
Per patient's daughter, who is a Software engineer, he has a history of snoring.  Family has suggested he get a sleep study, which he has resisted.  Daughter came to me with videos she had taken of him and his monitor this afternoon which appear to document periods of apnea.  Her hope is that physician will recommend he pursue sleep study and state so in the AVS.  Information on OSA entered into exit care and provided to patient and daughter at this time.

## 2018-04-30 NOTE — ED Notes (Signed)
Pt taken to cath lab. Denies pain.

## 2018-04-30 NOTE — Progress Notes (Signed)
Assisted to BR to void. Tolerated activity well. Back to stretcher. Rt wrist level 0. Lunch requested from service response. Waiting on 6c bed

## 2018-04-30 NOTE — Progress Notes (Signed)
ANTICOAGULATION CONSULT NOTE - Initial Consult  Pharmacy Consult for Heparin  Indication: NSTEMI  No Known Allergies  Patient Measurements: Height: 6\' 2"  (188 cm) Weight: 238 lb (108 kg) IBW/kg (Calculated) : 82.2  Vital Signs: Temp: 98.8 F (37.1 C) (05/06 2158) Temp Source: Oral (05/06 2158) BP: 168/93 (05/06 2300) Pulse Rate: 86 (05/06 2300)  Labs: Recent Labs    04/29/18 2226  HGB 14.4  HCT 41.0  PLT 209  CREATININE 1.09  TROPONINI 0.71*   Estimated Creatinine Clearance: 83.7 mL/min (by C-G formula based on SCr of 1.09 mg/dL).  Medical History: Past Medical History:  Diagnosis Date  . Dysthymia     Assessment: 72 y/o M with NSTEMI for heparin, PTA meds reviewed, CBC/renal function good, likely for cath  Goal of Therapy:  Heparin level 0.3-0.7 units/ml Monitor platelets by anticoagulation protocol: Yes   Plan:  Heparin 4000 units BOLUS Start heparin drip at 1200 units/hr 0900 HL Daily CBC/HL Monitor for bleeding  Narda Bonds 04/30/2018,12:12 AM

## 2018-04-30 NOTE — Progress Notes (Signed)
TR BAND REMOVAL  LOCATION:  right radial  DEFLATED PER PROTOCOL:  Yes.    TIME BAND OFF / DRESSING APPLIED:   1700   SITE UPON ARRIVAL:   Level 0  SITE AFTER BAND REMOVAL:  Level 0  CIRCULATION SENSATION AND MOVEMENT:  Within Normal Limits  Yes.    COMMENTS:    

## 2018-04-30 NOTE — ED Notes (Signed)
Pt's daughter, calling from [place], update given to her per patient request.

## 2018-04-30 NOTE — Interval H&P Note (Signed)
History and Physical Interval Note:  04/30/2018 9:18 AM  [place]  has presented today for surgery, with the diagnosis of NSTEMI.   The various methods of treatment have been discussed with the patient and family. After consideration of risks, benefits and other options for treatment, the patient has consented to  Procedure(s): LEFT HEART CATH AND CORONARY ANGIOGRAPHY (N/A)  With possible PERCUTANEOUS CORONARY INTERVENTION as a surgical intervention .  The patient's history has been reviewed, patient examined, no change in status, stable for surgery.  I have reviewed the patient's chart and labs.  Questions were answered to the patient's satisfaction.    Cath Lab Visit (complete for each Cath Lab visit)  Clinical Evaluation Leading to the Procedure:   ACS: Yes.    Non-ACS:    Anginal Classification: CCS IV  Anti-ischemic medical therapy: No Therapy  Non-Invasive Test Results: No non-invasive testing performed  Prior CABG: No previous CABG    Glenetta Hew

## 2018-05-01 ENCOUNTER — Inpatient Hospital Stay (HOSPITAL_COMMUNITY): Payer: Medicare Other

## 2018-05-01 ENCOUNTER — Encounter (HOSPITAL_COMMUNITY): Payer: Self-pay | Admitting: Cardiology

## 2018-05-01 DIAGNOSIS — E782 Mixed hyperlipidemia: Secondary | ICD-10-CM

## 2018-05-01 DIAGNOSIS — E785 Hyperlipidemia, unspecified: Secondary | ICD-10-CM

## 2018-05-01 DIAGNOSIS — I340 Nonrheumatic mitral (valve) insufficiency: Secondary | ICD-10-CM

## 2018-05-01 LAB — CBC
HCT: 43.9 % (ref 39.0–52.0)
HEMOGLOBIN: 15.8 g/dL (ref 13.0–17.0)
MCH: 31.7 pg (ref 26.0–34.0)
MCHC: 36 g/dL (ref 30.0–36.0)
MCV: 88 fL (ref 78.0–100.0)
Platelets: 186 10*3/uL (ref 150–400)
RBC: 4.99 MIL/uL (ref 4.22–5.81)
RDW: 12.7 % (ref 11.5–15.5)
WBC: 8.6 10*3/uL (ref 4.0–10.5)

## 2018-05-01 LAB — BASIC METABOLIC PANEL
ANION GAP: 7 (ref 5–15)
BUN: 10 mg/dL (ref 6–20)
CALCIUM: 8.9 mg/dL (ref 8.9–10.3)
CO2: 24 mmol/L (ref 22–32)
Chloride: 103 mmol/L (ref 101–111)
Creatinine, Ser: 0.85 mg/dL (ref 0.61–1.24)
GFR calc non Af Amer: >60 mL/min (ref >60)
Glucose, Bld: 102 mg/dL — ABNORMAL HIGH (ref 65–99)
Potassium: 3.9 mmol/L (ref 3.5–5.1)
Sodium: 134 mmol/L — ABNORMAL LOW (ref 135–145)

## 2018-05-01 LAB — ECHOCARDIOGRAM COMPLETE
HEIGHTINCHES: 74 in
WEIGHTICAEL: 3763.69 [oz_av]

## 2018-05-01 LAB — LIPID PANEL
CHOL/HDL RATIO: 3.1 ratio
Cholesterol: 173 mg/dL (ref 0–200)
HDL: 56 mg/dL (ref >40)
LDL CALC: 105 mg/dL — AB (ref 0–99)
Triglycerides: 62 mg/dL (ref <150)
VLDL: 12 mg/dL (ref 0–40)

## 2018-05-01 LAB — POCT ACTIVATED CLOTTING TIME
ACTIVATED CLOTTING TIME: 235 s
Activated Clotting Time: 263 seconds
Activated Clotting Time: 263 seconds

## 2018-05-01 MED ORDER — METOPROLOL TARTRATE 25 MG PO TABS: 12.5000 mg | ORAL_TABLET | Freq: Three times a day (TID) | ORAL | 2 refills | Status: DC

## 2018-05-01 MED ORDER — ATORVASTATIN CALCIUM 80 MG PO TABS: 80.0000 mg | ORAL_TABLET | Freq: Every day | ORAL | 1 refills | Status: AC

## 2018-05-01 MED ORDER — TICAGRELOR 90 MG PO TABS: 90.0000 mg | ORAL_TABLET | Freq: Two times a day (BID) | ORAL | 2 refills | Status: DC

## 2018-05-01 MED ORDER — METOPROLOL TARTRATE 25 MG PO TABS: 12.5000 mg | ORAL_TABLET | Freq: Two times a day (BID) | ORAL | 2 refills | Status: DC

## 2018-05-01 MED ORDER — NITROGLYCERIN 0.4 MG SL SUBL: 0.4000 mg | SUBLINGUAL_TABLET | SUBLINGUAL | 1 refills | Status: DC | PRN

## 2018-05-01 MED FILL — Heparin Sod (Porcine)-NaCl IV Soln 1000 Unit/500ML-0.9%: INTRAVENOUS | Qty: 1000 | Status: AC

## 2018-05-01 NOTE — Progress Notes (Signed)
CARDIAC REHAB PHASE I   PRE:  Rate/Rhythm: 24 SR  BP:  Sitting: 66    MODE:  Ambulation: 550 ft   POST:  Rate/Rhythm: 78 SR  BP:  Sitting: 165/86    0830-0920  Pt ambulated 550 ft with no complaints.  Education done with wife and daughter including stent card, Brilinta, NTG use, diet, and exercise.  All questions answered and pt and family verbalized understanding.  Case management needs to see pt in regards to Brilinta before D/C. CRP II Referral made to [facility].   Noel Christmas, RN 05/01/2018 9:14 AM

## 2018-05-01 NOTE — Discharge Summary (Addendum)
Discharge Summary    Patient ID: Matthew Nunez,  MRN: [id number], DOB/AGE: 1946-05-29 72 y.o.  Admit date: 04/29/2018 Discharge date: 05/01/2018  Primary Care Provider: Lawerance Nunez Primary Cardiologist: Dr. Marlou Porch  Discharge Diagnoses    Active Problems:   NSTEMI (non-ST elevated myocardial infarction) [facility])   Hyperlipidemia   Allergies No Known Allergies  Diagnostic Studies/Procedures    Cath: 04/30/18  Conclusion     Mid RCA lesion is 99% stenosed.  A drug-eluting stent was successfully placed using a STENT SYNERGY DES [id number]. Postdilated to 2.85-2.9 mm  Post intervention, there is a 0% residual stenosis.  Mid LAD lesion is 45% stenosed -long eccentric lesion.  There is mild left ventricular systolic dysfunction.  LV end diastolic pressure is moderately elevated. The left ventricular ejection fraction is 35-45% by visual estimate.   Severe single-vessel disease with 99% stenosis in mid RCA treated with a single DES stent Mild to moderately reduced EF with moderately elevated EDP  Plan: Monitor overnight and anticipate discharge tomorrow  TR band removal per protocol  Continue aggressive risk factor modification  Dual antibiotic therapy for minimum of 3 months, could then stop aspirin but would continue Brilinta for minimum 1 year.    Glenetta Hew, M.D., M.S. Interventional Cardiologist    TTE: 05/01/18  Study Conclusions  - Left ventricle: The cavity size was normal. Wall thickness was   increased in a pattern of mild LVH. Systolic function was normal.   The estimated ejection fraction was in the range of 55% to 60%.   Wall motion was normal; there were no regional wall motion   abnormalities. Doppler parameters are consistent with abnormal   left ventricular relaxation (grade 1 diastolic dysfunction). The   E/e&' ratio is between 8-15, suggesting indeterminate LV filling   pressure. - Mitral valve: Calcified annulus. Mildly  thickened leaflets .   There was mild regurgitation. - Left atrium: The atrium was normal in size. - Right atrium: The atrium was mildly dilated. - Inferior vena cava: The vessel was dilated. The respirophasic   diameter changes were blunted (< 50%), consistent with elevated   central venous pressure.  Impressions:  - Compared to a prior study in 2017, there are few changes. _____________   History of Present Illness     72 yo male with no significant PMH. He had an evaluation by Dr. Marlou Porch in 2017 for RBBB and type 1 second degree AV block.  Echo at that time showed EF 55-60% with moderate LV hypertrophy.  He had no further cardiac issues until the past Saturday prior to admission.  He currently works at a liquor store and noted at work that day that his chest was heavy and he felt some pain in his arms.  This was not particularly bothersome.  Later in the day, he noted chest heaviness again walking up a hill with his dog.  On Sunday, he noted heaviness in chest and shortness of breath walking up a hill with his dog.  He did not have any symptoms when he presented to the ED, but was seen by his PCP this afternoon for evaluation of his symptoms from the weekend.  Troponin was drawn, and he was called and told that it was positive and that he needed to go to the ER.   In the ER, he was chest pain free.  No complaints at this time.  TnI in ER was 0.44 => 0.71.  ECG showed RBBB, which is  chronic.   Patient has no history of smoking and no FH of CAD.  He does not have a history of HTN, hyperlipidemia, or DM.   Hospital Course     He was admitted and started on IV heparin. Trop peaked at 1.03. Underwent cardiac cath noted above with 99% lesion in the mRCA treated with PCI/DES x1. Mild to moderately reduced EF with moderately elevated EDP. Plan for DAPT with ASA/Brilinta for at least 3 months, then can consider dropping ASA with the continuation of Brilinta. He was started on high dose statin  and metoprolol 12.5mg  BID. LDL 105. No further chest pain. Worked well with cardiac rehab without recurrent chest pain. Follow up echo showed normal EF with G1DD.   General: Well developed, well nourished, male appearing in no acute distress. Head: Normocephalic, atraumatic.  Neck: Supple without bruits, JVD. Lungs:  Resp regular and unlabored, CTA. Heart: RRR, S1, S2, no S3, S4, or murmur; no rub. Abdomen: Soft, non-tender, non-distended with normoactive bowel sounds. No hepatomegaly. No rebound/guarding. No obvious abdominal masses. Extremities: No clubbing, cyanosis, edema. Distal pedal pulses are 2+ bilaterally. R radial cath site stable without bruising or hematoma Neuro: Alert and oriented X 3. Moves all extremities spontaneously. Psych: Normal affect.  [name] was seen by Dr. Irish Lack and determined stable for discharge home. Follow up in the office has been arranged. Medications are listed below.   _____________  Discharge Vitals Blood pressure (!) 136/95, pulse 61, temperature 97.9 F (36.6 C), temperature source Oral, resp. rate 13, height 6\' 2"  (1.88 m), weight 235 lb 3.7 oz (106.7 kg), SpO2 100 %.  Filed Weights   04/29/18 2158 05/01/18 0231  Weight: 238 lb (108 kg) 235 lb 3.7 oz (106.7 kg)    Labs & Radiologic Studies    CBC Recent Labs    04/30/18 0523 05/01/18 0226  WBC 7.6 8.6  NEUTROABS 3.7  --   HGB 14.0 15.8  HCT 40.5 43.9  MCV 89.0 88.0  PLT 196 045   Basic Metabolic Panel Recent Labs    04/30/18 0523 05/01/18 0226  NA 136 134*  K 3.8 3.9  CL 102 103  CO2 26 24  GLUCOSE 96 102*  BUN 18 10  CREATININE 0.96 0.85  CALCIUM 8.7* 8.9   Liver Function Tests No results for input(s): AST, ALT, ALKPHOS, BILITOT, PROT, ALBUMIN in the last 72 hours. No results for input(s): LIPASE, AMYLASE in the last 72 hours. Cardiac Enzymes Recent Labs    04/29/18 2226 04/30/18 0523 04/30/18 1416  TROPONINI 0.71* 0.76* 1.03*   BNP Invalid  input(s): POCBNP D-Dimer No results for input(s): DDIMER in the last 72 hours. Hemoglobin A1C No results for input(s): HGBA1C in the last 72 hours. Fasting Lipid Panel Recent Labs    05/01/18 0226  CHOL 173  HDL 56  LDLCALC 105*  TRIG 62  CHOLHDL 3.1   Thyroid Function Tests No results for input(s): TSH, T4TOTAL, T3FREE, THYROIDAB in the last 72 hours.  Invalid input(s): FREET3 _____________  Dg Chest 2 View  Result Date: 04/29/2018 CLINICAL DATA:  72 year old male with chest pain. EXAM: CHEST - 2 VIEW COMPARISON:  None. FINDINGS: The lungs are clear. There is no pleural effusion or pneumothorax. The cardiac silhouette is within normal limits. Lower thoracic dextroscoliosis. No acute osseous pathology. IMPRESSION: No active cardiopulmonary disease. Electronically Signed   By: Anner Crete M.D.   On: 04/29/2018 22:59   Disposition   Pt is being discharged home today  in good condition.  Follow-up Plans & Appointments    Follow-up Information    Isaiah Serge, NP Follow up on 05/08/2018.   Specialties:  Cardiology, Radiology Why:  at 2pm for your follow up appt.  Contact information: [address] [place] [address] [phone]          Discharge Instructions    Amb Referral to Cardiac Rehabilitation   Complete by:  As directed    Diagnosis:   PTCA NSTEMI Coronary Stents     Call MD for:  redness, tenderness, or signs of infection (pain, swelling, redness, odor or green/yellow discharge around incision site)   Complete by:  As directed    Diet - low sodium heart healthy   Complete by:  As directed    Discharge instructions   Complete by:  As directed    Radial Site Care Refer to this sheet in the next few weeks. These instructions provide you with information on caring for yourself after your procedure. Your caregiver may also give you more specific instructions. Your treatment has been planned according to current medical practices, but  problems sometimes occur. Call your caregiver if you have any problems or questions after your procedure. HOME CARE INSTRUCTIONS You may shower the day after the procedure.Remove the bandage (dressing) and gently wash the site with plain soap and water.Gently pat the site dry.  Do not apply powder or lotion to the site.  Do not submerge the affected site in water for 3 to 5 days.  Inspect the site at least twice daily.  Do not flex or bend the affected arm for 24 hours.  No lifting over 5 pounds (2.3 kg) for 5 days after your procedure.  Do not drive home if you are discharged the same day of the procedure. Have someone else drive you.  You may drive 24 hours after the procedure unless otherwise instructed by your caregiver.  What to expect: Any bruising will usually fade within 1 to 2 weeks.  Blood that collects in the tissue (hematoma) may be painful to the touch. It should usually decrease in size and tenderness within 1 to 2 weeks.  SEEK IMMEDIATE MEDICAL CARE IF: You have unusual pain at the radial site.  You have redness, warmth, swelling, or pain at the radial site.  You have drainage (other than a small amount of blood on the dressing).  You have chills.  You have a fever or persistent symptoms for more than 72 hours.  You have a fever and your symptoms suddenly get worse.  Your arm becomes pale, cool, tingly, or numb.  You have heavy bleeding from the site. Hold pressure on the site.   PLEASE DO NOT MISS ANY DOSES OF YOUR BRILINTA!!!!! Also keep a log of you blood pressures and bring back to your follow up appt. Please call the office with any questions.   Patients taking blood thinners should generally stay away from medicines like ibuprofen, Advil, Motrin, naproxen, and Aleve due to risk of stomach bleeding. You may take Tylenol as directed or talk to your primary doctor about alternatives.   Increase activity slowly   Complete by:  As directed       Discharge Medications      Medication List    STOP taking these medications   ibuprofen 200 MG tablet Commonly known as:  ADVIL,MOTRIN     TAKE these medications   acetaminophen 325 MG tablet Commonly known as:  TYLENOL  Take 325-650 mg by mouth every 6 (six) hours as needed for mild pain or headache. Notes to patient:  As needed for pain    aspirin EC 81 MG tablet Take 324 mg by mouth once as needed (for sudden onset of chest pain). Notes to patient:  Prevents clotting in stent and heart attack    atorvastatin 80 MG tablet Commonly known as:  LIPITOR Take 1 tablet (80 mg total) by mouth daily at 6 PM. Notes to patient:  Lowers cholesterol    CLARITIN 10 MG tablet Generic drug:  loratadine Take 10 mg by mouth daily. Notes to patient:  As needed for allergies    Magnesium 300 MG Caps Take 300 mg by mouth daily. Notes to patient:  Supplement    metoprolol tartrate 25 MG tablet Commonly known as:  LOPRESSOR Take 0.5 tablets (12.5 mg total) by mouth 2 (two) times daily.   nitroGLYCERIN 0.4 MG SL tablet Commonly known as:  NITROSTAT Place 1 tablet (0.4 mg total) under the tongue every 5 (five) minutes x 3 doses as needed for chest pain. Notes to patient:  As needed for chest pain    ticagrelor 90 MG Tabs tablet Commonly known as:  BRILINTA Take 1 tablet (90 mg total) by mouth 2 (two) times daily. Notes to patient:  Prevents clotting in stent and heart attack        Aspirin prescribed at discharge?  Yes High Intensity Statin Prescribed? (Lipitor 40-80mg  or Crestor 20-40mg ): Yes Beta Blocker Prescribed? Yes For EF <40%, was ACEI/ARB Prescribed? No: EF ok ADP Receptor Inhibitor Prescribed? (i.e. Plavix etc.-Includes Medically Managed Patients): Yes For EF <40%, Aldosterone Inhibitor Prescribed? No: EF ok Was EF assessed during THIS hospitalization? Yes Was Cardiac Rehab II ordered? (Included Medically managed Patients): Yes   Outstanding Labs/Studies   FLP/LFTs in 6 weeks if tolerating  statin.   Duration of Discharge Encounter   Greater than 30 minutes including physician time.  Signed, Reino Bellis NP-C 05/01/2018, 2:02 PM   I have examined the patient and reviewed assessment and plan and discussed with patient.  Agree with above as stated.  Stressed importance of DAPT.  Discussed wih his daughter who is a Software engineer at [employer].  Discussed lifestyle changes with the patient including healthy diet and regular exercise.  Right wrist site is stable.  I personally reviewed the echo images and his LV function appears normal with adequate valvular function. We discussed plaque rupture at length.    Will arrange f/u in the office.   Jettie Booze, MD

## 2018-05-01 NOTE — Progress Notes (Addendum)
4. S/W GEN @ [facility] # [phone]    BRILINTA 90 MG BID  COVER- YES  CO-PAY- 33 % OF TOTAL COAST  TIER- 4 DRUG  PRIOR APPROVAL- NO   DEDUCTIBLE NOT MET   PREFERRED PHARMACY : YES- [facility]  For  3 month supply 658.00, 1 month is 403.00

## 2018-05-01 NOTE — Care Management Note (Addendum)
Case Management Note  Patient Details  Name: Matthew Nunez MRN: [id number] Date of Birth: 17-May-1946  Subjective/Objective:  From home, s/p stent intervention , will be on brilinta, NCM spoke with patient's daughter who is pharmacy tech , informed her that the [place] for one month is 403.00 and 3 month is 658.00.  She states they would like to keep him on the brilinta,  Informed her he will have cardiology follow up in two weeks and they can give him some samples at the office also.  NCM spoke with Mendel Ryder PA  And informed her of patient's co pay.  RN Mickel Baas will give patient the 30 day free savings coupon. Pharmacy has it in stock.                   Action/Plan: DC home when ready.  Expected Discharge Date:                  Expected Discharge Plan:  Home/Self Care  In-House Referral:     Discharge planning Services  CM Consult, Medication Assistance  Post Acute Care Choice:    Choice offered to:     DME Arranged:    DME Agency:     HH Arranged:    HH Agency:     Status of Service:  Completed, signed off  If discussed at [employer] of Stay Meetings, dates discussed:    Additional Comments:  Zenon Mayo, RN 05/01/2018, 9:39 AM

## 2018-05-01 NOTE — Progress Notes (Signed)
  Echocardiogram 2D Echocardiogram has been performed.  Matthew Nunez 05/01/2018, 10:04 AM

## 2018-05-02 NOTE — Consult Note (Signed)
            [facility] CM Primary Care Navigator  05/02/2018  Matthew Nunez 22-Apr-1946 [id number]   Went toseepatient at the bedside to identify possible discharge needsbuthe was already dischargedhome.  Patient was seen and treated fornon-ST elevationmyocardial infarction- NSTEMI and hyperlipidemia (underwent left cardiac cath).  Primary care provider's officeis listed as providingtransition of care (TOC)follow-up.   Patient has discharge instruction to follow-up withcardiology after discharge- on 05/08/18.   For additional questions please contact:  Edwena Felty A. Bettyann Birchler, BSN, RN-BC [facility] PRIMARY CARE Navigator Cell: [phone]

## 2018-05-03 ENCOUNTER — Telehealth (HOSPITAL_COMMUNITY): Payer: Self-pay

## 2018-05-03 NOTE — Telephone Encounter (Signed)
Patients insurance is active and benefits verified through Medicare A/B - No co-pay, deductible amount of $185.00/$0.00 has been met, no out of pocket, 20% co-insurance, and no pre-authorization is required. Passport/reference [id number]  Patients insurance is active and benefits verified through [place] - No co-pay, no deductible, no out of pocket, no co-insurance, and no pre-authorization is required. Passport/reference [phone]  Will contact patient to see if he is interested in the Cardiac Rehab Program. If interested, patient will need to complete follow up appt. Once completed, patient will be contacted for scheduling upon review by the RN Navigator.

## 2018-05-03 NOTE — Telephone Encounter (Signed)
Called patient to see if he is interested in the Cardiac Rehab program. Patient stated he thinks he is interested in the program. Martin Majestic over scheduling process and insurance, patient verbalized understanding. Will contact patient for potential scheduling once follow up appt has been completed.

## 2018-05-08 ENCOUNTER — Ambulatory Visit (INDEPENDENT_AMBULATORY_CARE_PROVIDER_SITE_OTHER): Payer: Medicare Other | Admitting: Cardiology

## 2018-05-08 ENCOUNTER — Encounter: Payer: Self-pay | Admitting: Cardiology

## 2018-05-08 ENCOUNTER — Encounter: Payer: Self-pay | Admitting: *Deleted

## 2018-05-08 VITALS — BP 120/70 | HR 63 | Ht 73.0 in | Wt 232.0 lb

## 2018-05-08 DIAGNOSIS — E781 Pure hyperglyceridemia: Secondary | ICD-10-CM

## 2018-05-08 DIAGNOSIS — E782 Mixed hyperlipidemia: Secondary | ICD-10-CM | POA: Diagnosis not present

## 2018-05-08 DIAGNOSIS — I214 Non-ST elevation (NSTEMI) myocardial infarction: Secondary | ICD-10-CM

## 2018-05-08 DIAGNOSIS — I4510 Unspecified right bundle-branch block: Secondary | ICD-10-CM

## 2018-05-08 NOTE — Patient Instructions (Signed)
Your physician recommends that you continue on your current medications as directed. Please refer to the Current Medication list given to you today.  Your physician recommends that you return for lab work in:  4 weeks... Fasting lipids, cmet, and magnesium  (due mid-June)  Your physician recommends that you schedule a follow-up appointment in 2-3 months with Dr. Marlou Porch.

## 2018-05-08 NOTE — Progress Notes (Signed)
Cardiology Office Note   Date:  05/08/2018   ID:  Matthew Nunez, DOB 12-11-1946, MRN [id number]  PCP:  Lawerance Cruel, MD  Cardiologist:  Dr. Marlou Porch    Chief Complaint  Patient presents with  . Hospitalization Follow-up      History of Present Illness: Matthew Nunez is a 72 y.o. male who presents for post hospitalization for NSTEMI. Admit 04/29/18 to 05/01/18.   Pt with no prior PMH was admitted 5/6 with episodes of chest heaviness.  He had seen his PCP and troponin was elevated so he was sent to ER.   Troponin 0.44 to 0.71,  EKG with chronic RBBB.  Cath with 99% mRCA and rec'd PCI DES X 1  EF mildly reduced by cath at 35-45% but Echo at 55-60% .  ASA and Brilinta for at least 3 months then consider Brilinta alone.. pk troponin 1.03. On statin and BB as well.   Today he is doing well, his wife Matthew Nunez is with him.  He has had no chest pain and no SOB.  He is walking and would like to attend cardiac rehab.  Will send in ok.  He would like to return to work, will begin 4-5 hour shifts only and no lifting over 15 lbs.  No giving blood for at least 6 months.  He is eating healthy.  He is taking his meds without fail   He is having a sleep study in near future.    Past Medical History:  Diagnosis Date  . Coronary artery disease   . Dysthymia   . Mobitz type 1 second degree AV block    history of   . NSTEMI (non-ST elevated myocardial infarction) ([place])    04/30/18 PCI/DES to the mRCA, normal EF  . Varicose veins of legs     Past Surgical History:  Procedure Laterality Date  . CORONARY STENT INTERVENTION N/A 04/30/2018   Procedure: CORONARY STENT INTERVENTION;  Surgeon: Leonie Man, MD;  Location: [place] [facility];  Service: Cardiovascular;  Laterality: N/A;  . HERNIA REPAIR    . LEFT HEART CATH AND CORONARY ANGIOGRAPHY N/A 04/30/2018   Procedure: LEFT HEART CATH AND CORONARY ANGIOGRAPHY;  Surgeon: Leonie Man, MD;  Location: [place] [facility];  Service:  Cardiovascular;  Laterality: N/A;  . VERICOSE VEIN SURGERY  2000     Current Outpatient Medications  Medication Sig Dispense Refill  . aspirin EC 81 MG tablet Take 324 mg by mouth once as needed (for sudden onset of chest pain).     Marland Kitchen atorvastatin (LIPITOR) 80 MG tablet Take 1 tablet (80 mg total) by mouth daily at 6 PM. 90 tablet 1  . metoprolol tartrate (LOPRESSOR) 25 MG tablet Take 0.5 tablets (12.5 mg total) by mouth 2 (two) times daily. 60 tablet 2  . nitroGLYCERIN (NITROSTAT) 0.4 MG SL tablet Place 1 tablet (0.4 mg total) under the tongue every 5 (five) minutes x 3 doses as needed for chest pain. 25 tablet 1  . ticagrelor (BRILINTA) 90 MG TABS tablet Take 1 tablet (90 mg total) by mouth 2 (two) times daily. 180 tablet 2   No current facility-administered medications for this visit.     Allergies:   Patient has no known allergies.    Social History:  The patient  reports that he has never smoked. He has never used smokeless tobacco. He reports that he drinks alcohol. He reports that he does not use drugs.   Family History:  The patient's family history includes Alzheimer's disease in his father; Cirrhosis in his brother; Healthy in his sister.    ROS:  General:no colds or fevers, no weight changes Skin:no rashes or ulcers HEENT:no blurred vision, no congestion CV:see HPI PUL:see HPI GI:no diarrhea constipation or melena, no indigestion GU:no hematuria, no dysuria MS:no joint pain, no claudication Neuro:no syncope, no lightheadedness Endo:no diabetes, no thyroid disease  Wt Readings from Last 3 Encounters:  05/08/18 232 lb (105.2 kg)  05/01/18 235 lb 3.7 oz (106.7 kg)  02/06/17 230 lb 3.2 oz (104.4 kg)     PHYSICAL EXAM: VS:  BP 120/70   Pulse 63   Ht 6\' 1"  (1.854 m)   Wt 232 lb (105.2 kg)   SpO2 96%   BMI 30.61 kg/m  , BMI Body mass index is 30.61 kg/m. General:Pleasant affect, NAD Skin:Warm and dry, brisk capillary refill HEENT:normocephalic, sclera clear,  mucus membranes moist Neck:supple, no JVD, no bruits  Heart:S1S2 RRR without murmur, gallup, rub or click Lungs:clear without rales, rhonchi, or wheezes MWU:XLKG, non tender, + BS, do not palpate liver spleen or masses Ext:no lower ext edema, 2+ pedal pulses, 2+ radial pulses Neuro:alert and oriented X 3, MAE, follows commands, + facial symmetry    EKG:  EKG is ordered today. The ekg ordered today demonstrates SR at 15 with chronic RBBB.    Recent Labs: 05/01/2018: BUN 10; Creatinine, Ser 0.85; Hemoglobin 15.8; Platelets 186; Potassium 3.9; Sodium 134    Lipid Panel    Component Value Date/Time   CHOL 173 05/01/2018 0226   TRIG 62 05/01/2018 0226   HDL 56 05/01/2018 0226   CHOLHDL 3.1 05/01/2018 0226   VLDL 12 05/01/2018 0226   LDLCALC 105 (H) 05/01/2018 0226       Other studies Reviewed: Additional studies/ records that were reviewed today include: .  Procedures  04/30/18 CORONARY STENT INTERVENTION  LEFT HEART CATH AND CORONARY ANGIOGRAPHY  Conclusion     Mid RCA lesion is 99% stenosed.  A drug-eluting stent was successfully placed using a STENT SYNERGY DES 2.75X24. Postdilated to 2.85-2.9 mm  Post intervention, there is a 0% residual stenosis.  Mid LAD lesion is 45% stenosed -long eccentric lesion.  There is mild left ventricular systolic dysfunction.  LV end diastolic pressure is moderately elevated. The left ventricular ejection fraction is 35-45% by visual estimate.   Severe single-vessel disease with 99% stenosis in mid RCA treated with a single DES stent Mild to moderately reduced EF with moderately elevated EDP  Plan: Monitor overnight and anticipate discharge tomorrow  TR band removal per protocol  Continue aggressive risk factor modification  Dual antibiotic therapy for minimum of 3 months, could then stop aspirin but would continue Brilinta for minimum 1 year.    Glenetta Hew, M.D., M.S.   Echo 05/01/18 Study Conclusions  - Left  ventricle: The cavity size was normal. Wall thickness was   increased in a pattern of mild LVH. Systolic function was normal.   The estimated ejection fraction was in the range of 55% to 60%.   Wall motion was normal; there were no regional wall motion   abnormalities. Doppler parameters are consistent with abnormal   left ventricular relaxation (grade 1 diastolic dysfunction). The   E/e&' ratio is between 8-15, suggesting indeterminate LV filling   pressure. - Mitral valve: Calcified annulus. Mildly thickened leaflets .   There was mild regurgitation. - Left atrium: The atrium was normal in size. - Right atrium: The atrium was  mildly dilated. - Inferior vena cava: The vessel was dilated. The respirophasic   diameter changes were blunted (< 50%), consistent with elevated   central venous pressure.  Impressions:  - Compared to a prior study in 2017, there are few changes.  ASSESSMENT AND PLAN:  1.  Recent NSTEMI with stent DES to RCA, mild troponin increase.  On ASA and Brilinta, continue for 1 year - though follow up with Dr. Marlou Porch in 3 months for possible stopping ASA and continuing the Brilinta per Dr. Ellyn Hack  2.  Residual CAD of 45%, on statin and BB.  Continue with exercise, add cardiac rehab.    3.  HLD will recheck lipids in [redacted] weeks along with magnesium.   4.  LV dysfunction on cath but echo was back to normal.   5.  Chronic RBBB   Current medicines are reviewed with the patient today.  The patient Has no concerns regarding medicines.  The following changes have been made:  See above Labs/ tests ordered today include:see above  Disposition:   FU:  see above  Signed, Cecilie Kicks, NP  05/08/2018 2:12 PM    [address], [address] [place], [place] Phone: [phone]; Fax: [phone]

## 2018-05-22 DIAGNOSIS — I2510 Atherosclerotic heart disease of native coronary artery without angina pectoris: Secondary | ICD-10-CM | POA: Diagnosis not present

## 2018-05-22 DIAGNOSIS — G4719 Other hypersomnia: Secondary | ICD-10-CM | POA: Diagnosis not present

## 2018-05-22 DIAGNOSIS — N529 Male erectile dysfunction, unspecified: Secondary | ICD-10-CM | POA: Diagnosis not present

## 2018-05-22 DIAGNOSIS — R0683 Snoring: Secondary | ICD-10-CM | POA: Diagnosis not present

## 2018-05-31 ENCOUNTER — Telehealth (HOSPITAL_COMMUNITY): Payer: Self-pay

## 2018-05-31 NOTE — Telephone Encounter (Signed)
Called patient to schedule Cardiac Rehab - Scheduled orientation on 07/23/2018 at 8:00am. Patient will attend the 8:15am exc class. Mailed packet.

## 2018-06-07 ENCOUNTER — Other Ambulatory Visit: Payer: Medicare Other | Admitting: *Deleted

## 2018-06-07 DIAGNOSIS — I4510 Unspecified right bundle-branch block: Secondary | ICD-10-CM | POA: Diagnosis not present

## 2018-06-07 DIAGNOSIS — I214 Non-ST elevation (NSTEMI) myocardial infarction: Secondary | ICD-10-CM

## 2018-06-07 DIAGNOSIS — E782 Mixed hyperlipidemia: Secondary | ICD-10-CM

## 2018-06-07 LAB — COMPREHENSIVE METABOLIC PANEL
ALK PHOS: 85 IU/L (ref 39–117)
ALT: 28 IU/L (ref 0–44)
AST: 29 IU/L (ref 0–40)
Albumin/Globulin Ratio: 1.6 (ref 1.2–2.2)
Albumin: 4.1 g/dL (ref 3.6–4.8)
BUN/Creatinine Ratio: 17 (ref 10–24)
BUN: 14 mg/dL (ref 8–27)
Bilirubin Total: 0.9 mg/dL (ref 0.0–1.2)
CALCIUM: 9.4 mg/dL (ref 8.6–10.2)
CO2: 26 mmol/L (ref 20–29)
CREATININE: 0.82 mg/dL (ref 0.76–1.27)
Chloride: 100 mmol/L (ref 96–106)
GFR calc Af Amer: 104 mL/min/{1.73_m2} (ref >59)
GFR calc non Af Amer: 90 mL/min/{1.73_m2} (ref >59)
GLUCOSE: 84 mg/dL (ref 65–99)
Globulin, Total: 2.6 g/dL (ref 1.5–4.5)
Potassium: 4.5 mmol/L (ref 3.5–5.2)
Sodium: 138 mmol/L (ref 134–144)
Total Protein: 6.7 g/dL (ref 6.0–8.5)

## 2018-06-07 LAB — LIPID PANEL
CHOLESTEROL TOTAL: 100 mg/dL (ref 100–199)
Chol/HDL Ratio: 2.3 ratio (ref 0.0–5.0)
HDL: 43 mg/dL (ref >39)
LDL Calculated: 48 mg/dL (ref 0–99)
TRIGLYCERIDES: 47 mg/dL (ref 0–149)
VLDL CHOLESTEROL CAL: 9 mg/dL (ref 5–40)

## 2018-06-07 LAB — MAGNESIUM: MAGNESIUM: 1.9 mg/dL (ref 1.6–2.3)

## 2018-07-10 DIAGNOSIS — G4733 Obstructive sleep apnea (adult) (pediatric): Secondary | ICD-10-CM | POA: Diagnosis not present

## 2018-07-15 ENCOUNTER — Telehealth (HOSPITAL_COMMUNITY): Payer: Self-pay | Admitting: Pharmacist

## 2018-07-16 NOTE — Telephone Encounter (Signed)
Cardiac Rehab Medication Review by a Pharmacist  Does the patient  feel that his/her medications are working for him/her?  yes  Has the patient been experiencing any side effects to the medications prescribed?  no  Does the patient measure his/her own blood pressure or blood glucose at home?  yes   Does the patient have any problems obtaining medications due to transportation or finances?   Yes  Understanding of regimen: good Understanding of indications: good Potential of compliance: good    Pharmacist comments: Patient states ticagrelor is becoming expensive for him to purchase.  Last BP reported 122/70   Gwenlyn Found, [place] D PGY1 Pharmacy Resident  Phone [phone] 07/16/2018   4:12 PM

## 2018-07-22 ENCOUNTER — Encounter (HOSPITAL_COMMUNITY): Payer: Self-pay

## 2018-07-23 ENCOUNTER — Encounter (HOSPITAL_COMMUNITY): Payer: Self-pay

## 2018-07-23 ENCOUNTER — Encounter (HOSPITAL_COMMUNITY)
Admission: RE | Admit: 2018-07-23 | Discharge: 2018-07-23 | Disposition: A | Discharge status: Home / Self Care | Payer: Medicare Other | Source: Ambulatory Visit | Attending: Cardiology | Admitting: Cardiology

## 2018-07-23 VITALS — Ht 72.75 in | Wt 222.9 lb

## 2018-07-23 DIAGNOSIS — Z955 Presence of coronary angioplasty implant and graft: Secondary | ICD-10-CM | POA: Diagnosis not present

## 2018-07-23 DIAGNOSIS — I214 Non-ST elevation (NSTEMI) myocardial infarction: Secondary | ICD-10-CM

## 2018-07-23 NOTE — Progress Notes (Addendum)
Cardiac Individual Treatment Plan  Patient Details  Name: Matthew Nunez MRN: [id number] Date of Birth: 1946-05-22 Referring Provider:   Flowsheet Row CARDIAC REHAB PHASE II ORIENTATION from 07/23/2018 in [facility]  Referring Provider  Candee Furbish MD      Initial Encounter Date:  [place] from 07/23/2018 in [place]  Date  07/23/18      Visit Diagnosis: 04/29/2018 NSTEMI (non-ST elevated myocardial infarction) ([place])  04/29/2018 Stented coronary artery  Patient's Home Medications on Admission:  Current Outpatient Medications:  .  aspirin EC 81 MG tablet, Take 81 mg by mouth once as needed (for sudden onset of chest pain). , Disp: , Rfl:  .  atorvastatin (LIPITOR) 80 MG tablet, Take 1 tablet (80 mg total) by mouth daily at 6 PM., Disp: 90 tablet, Rfl: 1 .  metoprolol tartrate (LOPRESSOR) 25 MG tablet, Take 0.5 tablets (12.5 mg total) by mouth 2 (two) times daily., Disp: 60 tablet, Rfl: 2 .  nitroGLYCERIN (NITROSTAT) 0.4 MG SL tablet, Place 1 tablet (0.4 mg total) under the tongue every 5 (five) minutes x 3 doses as needed for chest pain., Disp: 25 tablet, Rfl: 1 .  ticagrelor (BRILINTA) 90 MG TABS tablet, Take 1 tablet (90 mg total) by mouth 2 (two) times daily., Disp: 180 tablet, Rfl: 2  Past Medical History: Past Medical History:  Diagnosis Date  . Coronary artery disease   . Dysthymia   . Mobitz type 1 second degree AV block    history of   . NSTEMI (non-ST elevated myocardial infarction) ([place])    04/30/18 PCI/DES to the mRCA, normal EF  . Varicose veins of legs     Tobacco Use: Social History   Tobacco Use  Smoking Status Never Smoker  Smokeless Tobacco Never Used    Labs: Recent Review Scientist, physiological    Labs for ITP Cardiac and Pulmonary Rehab Latest Ref Rng & Units 05/01/2018 06/07/2018   Cholestrol 100 - 199 mg/dL 173 100   LDLCALC 0 - 99 mg/dL 105(H) 48    HDL >39 mg/dL 56 43   Trlycerides 0 - 149 mg/dL 62 47      Capillary Blood Glucose: No results found for: GLUCAP   Exercise Target Goals: Date: 07/23/18  Exercise Program Goal: Individual exercise prescription set using results from initial 6 min walk test and THRR while considering  patient's activity barriers and safety.   Exercise Prescription Goal: Initial exercise prescription builds to 30-45 minutes a day of aerobic activity, 2-3 days per week.  Home exercise guidelines will be given to patient during program as part of exercise prescription that the participant will acknowledge.  Activity Barriers & Risk Stratification: Activity Barriers & Cardiac Risk Stratification - 07/23/18 0838    Activity Barriers & Cardiac Risk Stratification          Activity Barriers  Deconditioning;Muscular Weakness    Cardiac Risk Stratification  High           6 Minute Walk: 6 Minute Walk    6 Minute Walk    Row Name 07/23/18 1101 07/23/18 1125   Phase  Initial  no documentation   Distance  1747 feet  no documentation   Walk Time  6 minutes  no documentation   # of Rest Breaks  0  no documentation   MPH  3.3  3.3   METS  3.4  3.6   RPE  9  no documentation   VO2 Peak  11.9  12.6   Symptoms  No  no documentation   Resting HR  69 bpm  no documentation   Resting BP  138/64  no documentation   Resting Oxygen Saturation   95 %  no documentation   Exercise Oxygen Saturation  during 6 min walk  98 %  no documentation   Max Ex. HR  87 bpm  no documentation   Max Ex. BP  142/80  no documentation   2 Minute Post BP  130/70  no documentation          Oxygen Initial Assessment:   Oxygen Re-Evaluation:   Oxygen Discharge (Final Oxygen Re-Evaluation):   Initial Exercise Prescription: Initial Exercise Prescription - 07/23/18 1100    Date of Initial Exercise RX and Referring Provider          Date  07/23/18    Referring Provider  Candee Furbish MD        Treadmill           MPH  --    Grade  --    Minutes  --    METs  --        Bike          Level  0.8    Minutes  10    METs  2.51        NuStep          Level  3    SPM  80    Minutes  10    METs  2.5        Track          Laps  12    Minutes  10    METs  3.09        Prescription Details          Frequency (times per week)  3    Duration  Progress to 30 minutes of continuous aerobic without signs/symptoms of physical distress        Intensity          THRR 40-80% of Max Heartrate  60-121    Ratings of Perceived Exertion  11-13    Perceived Dyspnea  0-4        Progression          Progression  Continue to progress workloads to maintain intensity without signs/symptoms of physical distress.        Resistance Training          Training Prescription  Yes    Weight  4lbs    Reps  10-15           Perform Capillary Blood Glucose checks as needed.  Exercise Prescription Changes:   Exercise Comments:   Exercise Goals and Review:  Exercise Goals    Exercise Goals    Row Name 07/23/18 0839   Increase Physical Activity  Yes   Intervention  Provide advice, education, support and counseling about physical activity/exercise needs.;Develop an individualized exercise prescription for aerobic and resistive training based on initial evaluation findings, risk stratification, comorbidities and participant's personal goals.   Expected Outcomes  Short Term: Attend rehab on a regular basis to increase amount of physical activity.;Long Term: Exercising regularly at least 3-5 days a week.;Long Term: Add in home exercise to make exercise part of routine and to increase amount of physical activity.   Increase Strength and Stamina  Yes   Intervention  Provide advice,  education, support and counseling about physical activity/exercise needs.;Develop an individualized exercise prescription for aerobic and resistive training based on initial evaluation findings, risk stratification, comorbidities  and participant's personal goals.   Expected Outcomes  Short Term: Increase workloads from initial exercise prescription for resistance, speed, and METs.;Short Term: Perform resistance training exercises routinely during rehab and add in resistance training at home;Long Term: Improve cardiorespiratory fitness, muscular endurance and strength as measured by increased METs and functional capacity (6MWT)   Able to understand and use rate of perceived exertion (RPE) scale  Yes   Intervention  Provide education and explanation on how to use RPE scale   Expected Outcomes  Short Term: Able to use RPE daily in rehab to express subjective intensity level;Long Term:  Able to use RPE to guide intensity level when exercising independently   Knowledge and understanding of Target Heart Rate Range (THRR)  Yes   Intervention  Provide education and explanation of THRR including how the numbers were predicted and where they are located for reference   Expected Outcomes  Short Term: Able to state/look up THRR;Long Term: Able to use THRR to govern intensity when exercising independently;Short Term: Able to use daily as guideline for intensity in rehab   Able to check pulse independently  Yes   Intervention  Provide education and demonstration on how to check pulse in carotid and radial arteries.;Review the importance of being able to check your own pulse for safety during independent exercise   Expected Outcomes  Short Term: Able to explain why pulse checking is important during independent exercise;Long Term: Able to check pulse independently and accurately   Understanding of Exercise Prescription  Yes   Intervention  Provide education, explanation, and written materials on patient's individual exercise prescription   Expected Outcomes  Short Term: Able to explain program exercise prescription;Long Term: Able to explain home exercise prescription to exercise independently          Exercise Goals Re-Evaluation  :    Discharge Exercise Prescription (Final Exercise Prescription Changes):   Nutrition:  Target Goals: Understanding of nutrition guidelines, daily intake of sodium 1500mg , cholesterol 200mg , calories 30% from fat and 7% or less from saturated fats, daily to have 5 or more servings of fruits and vegetables.  Biometrics: Pre Biometrics - 07/23/18 1127    Pre Biometrics          Height  6' 0.75" (1.848 m)    Weight  222 lb 14.2 oz (101.1 kg)    Waist Circumference  42 inches    Hip Circumference  46.5 inches    Waist to Hip Ratio  0.9 %    BMI (Calculated)  29.6    Triceps Skinfold  13 mm    % Body Fat  28.1 %    Grip Strength  55 kg    Flexibility  10.25 in    Single Leg Stand  16.59 seconds            Nutrition Therapy Plan and Nutrition Goals: Nutrition Therapy & Goals - 07/23/18 1046    Nutrition Therapy          Diet  heart healthy        Personal Nutrition Goals          Nutrition Goal  Pt to identify and limit food sources of saturated fat, trans fat, and sodium        Intervention Plan          Intervention  Prescribe, educate  and counsel regarding individualized specific dietary modifications aiming towards targeted core components such as weight, hypertension, lipid management, diabetes, heart failure and other comorbidities.    Expected Outcomes  Short Term Goal: Understand basic principles of dietary content, such as calories, fat, sodium, cholesterol and nutrients.           Nutrition Assessments: Nutrition Assessments - 07/23/18 1047    MEDFICTS Scores          Pre Score  27           Nutrition Goals Re-Evaluation:   Nutrition Goals Re-Evaluation:   Nutrition Goals Discharge (Final Nutrition Goals Re-Evaluation):   Psychosocial: Target Goals: Acknowledge presence or absence of significant depression and/or stress, maximize coping skills, provide positive support system. Participant is able to verbalize types and ability to use  techniques and skills needed for reducing stress and depression.  Initial Review & Psychosocial Screening: Initial Psych Review & Screening - 07/23/18 1118    Initial Review          Current issues with  None Identified        Family Dynamics          Good Support System?  Yes wife, daughter, son         Barriers          Psychosocial barriers to participate in program  There are no identifiable barriers or psychosocial needs.        Screening Interventions          Interventions  Encouraged to exercise           Quality of Life Scores: Quality of Life - 07/23/18 1121    Quality of Life          Select  Quality of Life        Quality of Life Scores          Health/Function Pre  27.3 %    Socioeconomic Pre  24.1 %    Psych/Spiritual Pre  26.4 %    Family Pre  26.4 %    GLOBAL Pre  26.3 %          Scores of 19 and below usually indicate a poorer quality of life in these areas.  A difference of  2-3 points is a clinically meaningful difference.  A difference of 2-3 points in the total score of the Quality of Life Index has been associated with significant improvement in overall quality of life, self-image, physical symptoms, and general health in studies assessing change in quality of life.  PHQ-9: Recent Review Flowsheet Data    There is no flowsheet data to display.     Interpretation of Total Score  Total Score Depression Severity:  1-4 = Minimal depression, 5-9 = Mild depression, 10-14 = Moderate depression, 15-19 = Moderately severe depression, 20-27 = Severe depression   Psychosocial Evaluation and Intervention:   Psychosocial Re-Evaluation:   Psychosocial Discharge (Final Psychosocial Re-Evaluation):   Vocational Rehabilitation: Provide vocational rehab assistance to qualifying candidates.   Vocational Rehab Evaluation & Intervention: Vocational Rehab - 07/23/18 1118    Initial Vocational Rehab Evaluation & Intervention          Assessment  shows need for Vocational Rehabilitation  No           Education: Education Goals: Education classes will be provided on a weekly basis, covering required topics. Participant will state understanding/return demonstration of topics presented.  Learning Barriers/Preferences: Learning Barriers/Preferences - 07/23/18 0845  Learning Barriers/Preferences          Radiation protection practitioner Preferences  Skilled Demonstration           Education Topics: Count Your Pulse:  -Group instruction provided by verbal instruction, demonstration, patient participation and written materials to support subject.  Instructors address importance of being able to find your pulse and how to count your pulse when at home without a heart monitor.  Patients get hands on experience counting their pulse with staff help and individually.   Heart Attack, Angina, and Risk Factor Modification:  -Group instruction provided by verbal instruction, video, and written materials to support subject.  Instructors address signs and symptoms of angina and heart attacks.    Also discuss risk factors for heart disease and how to make changes to improve heart health risk factors.   Functional Fitness:  -Group instruction provided by verbal instruction, demonstration, patient participation, and written materials to support subject.  Instructors address safety measures for doing things around the house.  Discuss how to get up and down off the floor, how to pick things up properly, how to safely get out of a chair without assistance, and balance training.   Meditation and Mindfulness:  -Group instruction provided by verbal instruction, patient participation, and written materials to support subject.  Instructor addresses importance of mindfulness and meditation practice to help reduce stress and improve awareness.  Instructor also leads participants through a meditation exercise.    Stretching for Flexibility and  Mobility:  -Group instruction provided by verbal instruction, patient participation, and written materials to support subject.  Instructors lead participants through series of stretches that are designed to increase flexibility thus improving mobility.  These stretches are additional exercise for major muscle groups that are typically performed during regular warm up and cool down.   Hands Only CPR:  -Group verbal, video, and participation provides a basic overview of AHA guidelines for community CPR. Role-play of emergencies allow participants the opportunity to practice calling for help and chest compression technique with discussion of AED use.   Hypertension: -Group verbal and written instruction that provides a basic overview of hypertension including the most recent diagnostic guidelines, risk factor reduction with self-care instructions and medication management.    Nutrition I class: Heart Healthy Eating:  -Group instruction provided by PowerPoint slides, verbal discussion, and written materials to support subject matter. The instructor gives an explanation and review of the Therapeutic Lifestyle Changes diet recommendations, which includes a discussion on lipid goals, dietary fat, sodium, fiber, plant stanol/sterol esters, sugar, and the components of a well-balanced, healthy diet.   Nutrition II class: Lifestyle Skills:  -Group instruction provided by PowerPoint slides, verbal discussion, and written materials to support subject matter. The instructor gives an explanation and review of label reading, grocery shopping for heart health, heart healthy recipe modifications, and ways to make healthier choices when eating out.   Diabetes Question & Answer:  -Group instruction provided by PowerPoint slides, verbal discussion, and written materials to support subject matter. The instructor gives an explanation and review of diabetes co-morbidities, pre- and post-prandial blood glucose goals,  pre-exercise blood glucose goals, signs, symptoms, and treatment of hypoglycemia and hyperglycemia, and foot care basics.   Diabetes Blitz:  -Group instruction provided by PowerPoint slides, verbal discussion, and written materials to support subject matter. The instructor gives an explanation and review of the physiology behind type 1 and type 2 diabetes, diabetes medications and rational behind using different medications,  pre- and post-prandial blood glucose recommendations and Hemoglobin A1c goals, diabetes diet, and exercise including blood glucose guidelines for exercising safely.    Portion Distortion:  -Group instruction provided by PowerPoint slides, verbal discussion, written materials, and food models to support subject matter. The instructor gives an explanation of serving size versus portion size, changes in portions sizes over the last 20 years, and what consists of a serving from each food group.   Stress Management:  -Group instruction provided by verbal instruction, video, and written materials to support subject matter.  Instructors review role of stress in heart disease and how to cope with stress positively.     Exercising on Your Own:  -Group instruction provided by verbal instruction, power point, and written materials to support subject.  Instructors discuss benefits of exercise, components of exercise, frequency and intensity of exercise, and end points for exercise.  Also discuss use of nitroglycerin and activating EMS.  Review options of places to exercise outside of rehab.  Review guidelines for sex with heart disease.   Cardiac Drugs I:  -Group instruction provided by verbal instruction and written materials to support subject.  Instructor reviews cardiac drug classes: antiplatelets, anticoagulants, beta blockers, and statins.  Instructor discusses reasons, side effects, and lifestyle considerations for each drug class.   Cardiac Drugs II:  -Group instruction  provided by verbal instruction and written materials to support subject.  Instructor reviews cardiac drug classes: angiotensin converting enzyme inhibitors (ACE-I), angiotensin II receptor blockers (ARBs), nitrates, and calcium channel blockers.  Instructor discusses reasons, side effects, and lifestyle considerations for each drug class.   Anatomy and Physiology of the Circulatory System:  Group verbal and written instruction and models provide basic cardiac anatomy and physiology, with the coronary electrical and arterial systems. Review of: AMI, Angina, Valve disease, Heart Failure, Peripheral Artery Disease, Cardiac Arrhythmia, Pacemakers, and the ICD.   Other Education:  -Group or individual verbal, written, or video instructions that support the educational goals of the cardiac rehab program.   Holiday Eating Survival Tips:  -Group instruction provided by PowerPoint slides, verbal discussion, and written materials to support subject matter. The instructor gives patients tips, tricks, and techniques to help them not only survive but enjoy the holidays despite the onslaught of food that accompanies the holidays.   Knowledge Questionnaire Score: Knowledge Questionnaire Score - 07/23/18 1118    Knowledge Questionnaire Score          Pre Score  24/24           Core Components/Risk Factors/Patient Goals at Admission:   Core Components/Risk Factors/Patient Goals Review:  Goals and Risk Factor Review    Core Components/Risk Factors/Patient Goals Review    Row Name 07/23/18 1141   Personal Goals Review  Other   Review  pt with no significant CAD RF.  pt demostrates eagerness to participate in CR program.     Expected Outcomes  pt will participate in CR exercise, nutrition and lifestyle modification opportunities.           Core Components/Risk Factors/Patient Goals at Discharge (Final Review):  Goals and Risk Factor Review - 07/23/18 1141    Core Components/Risk Factors/Patient  Goals Review          Personal Goals Review  Other    Review  pt with no significant CAD RF.  pt demostrates eagerness to participate in CR program.      Expected Outcomes  pt will participate in CR exercise, nutrition and lifestyle modification opportunities.  ITP Comments: ITP Comments    Row Name 07/22/18 1052   ITP Comments  Dr. Fransico Him, Medical Director       Comments: Patient attended orientation from [phone]  to [phone]  to review rules and guidelines for program. Completed 6 minute walk test, Intitial ITP, and exercise prescription.  VSS. Telemetry-sinus rhythm, first degree AV block, .  Asymptomatic.  Andi Hence, RN, BSN Cardiac Pulmonary Rehab

## 2018-07-23 NOTE — Progress Notes (Signed)
Matthew Nunez 72 y.o. male DOB: 03/08/1946 MRN: [id number]      Nutrition Note  1. 04/29/2018 NSTEMI (non-ST elevated myocardial infarction) ([place])   2. 04/29/2018 Stented coronary artery    Past Medical History:  Diagnosis Date  . Coronary artery disease   . Dysthymia   . Mobitz type 1 second degree AV block    history of   . NSTEMI (non-ST elevated myocardial infarction) ([place])    04/30/18 PCI/DES to the mRCA, normal EF  . Varicose veins of legs    Meds reviewed. Lipitor, lopressor noted  HT: Ht Readings from Last 1 Encounters:  05/08/18 6\' 1"  (1.854 m)    WT: Wt Readings from Last 5 Encounters:  05/08/18 232 lb (105.2 kg)  05/01/18 235 lb 3.7 oz (106.7 kg)  02/06/17 230 lb 3.2 oz (104.4 kg)  10/11/16 230 lb (104.3 kg)  09/18/16 233 lb (105.7 kg)     There is no height or weight on file to calculate BMI.   Current tobacco use? no      Labs:  Lipid Panel     Component Value Date/Time   CHOL 100 06/07/2018 0913   TRIG 47 06/07/2018 0913   HDL 43 06/07/2018 0913   CHOLHDL 2.3 06/07/2018 0913   CHOLHDL 3.1 05/01/2018 0226   VLDL 12 05/01/2018 0226   LDLCALC 48 06/07/2018 0913    No results found for: HGBA1C CBG (last 3)  No results for input(s): GLUCAP in the last 72 hours.  Nutrition Note Spoke with pt. Nutrition plan and goals reviewed with pt. Pt is following Step 2 of the Therapeutic Lifestyle Changes diet. Pt wants to maintain weight. Has intentionally changed his diet and lost 20 lbs as a result. Is currently very happy with his weight. Per discussion, pt does not use canned/convenience foods often. Pt rarely adds salt to food. Pt eats out infrequently. Wife does all of the cooking and has switched household to heart healthy diet. Pt expressed understanding of the information reviewed. Pt aware of nutrition education classes offered and plans on attending nutrition classes.  Nutrition Diagnosis ? Food-and nutrition-related knowledge deficit related to lack  of exposure to information as related to diagnosis of: ? CVD  Nutrition Intervention ? Pt's individual nutrition plan and goals reviewed with pt.  Nutrition Goal(s):  ? Pt to identify and limit food sources of saturated fat, trans fat, and sodium   Plan:  ? Pt to attend nutrition classes ? Nutrition I ? Nutrition II ? Portion Distortion  ? Will provide client-centered nutrition education as part of interdisciplinary care ? Monitor and evaluate progress toward nutrition goal with team.   Laurina Bustle, MS, RD, LDN 07/23/2018 10:39 AM

## 2018-07-29 ENCOUNTER — Encounter (HOSPITAL_COMMUNITY): Payer: Medicare Other

## 2018-07-31 ENCOUNTER — Encounter (HOSPITAL_COMMUNITY): Payer: Medicare Other

## 2018-08-02 ENCOUNTER — Encounter (HOSPITAL_COMMUNITY): Payer: Medicare Other

## 2018-08-05 ENCOUNTER — Encounter (HOSPITAL_COMMUNITY)
Admission: RE | Admit: 2018-08-05 | Discharge: 2018-08-05 | Disposition: A | Discharge status: Home / Self Care | Payer: Medicare Other | Source: Ambulatory Visit | Attending: Cardiology | Admitting: Cardiology

## 2018-08-05 ENCOUNTER — Encounter (HOSPITAL_COMMUNITY): Payer: Medicare Other

## 2018-08-05 DIAGNOSIS — I214 Non-ST elevation (NSTEMI) myocardial infarction: Secondary | ICD-10-CM | POA: Diagnosis not present

## 2018-08-05 DIAGNOSIS — Z955 Presence of coronary angioplasty implant and graft: Secondary | ICD-10-CM

## 2018-08-06 ENCOUNTER — Encounter: Payer: Self-pay | Admitting: Cardiology

## 2018-08-06 ENCOUNTER — Ambulatory Visit (INDEPENDENT_AMBULATORY_CARE_PROVIDER_SITE_OTHER): Payer: Medicare Other | Admitting: Cardiology

## 2018-08-06 VITALS — BP 104/60 | HR 60 | Ht 72.75 in | Wt 217.0 lb

## 2018-08-06 DIAGNOSIS — E782 Mixed hyperlipidemia: Secondary | ICD-10-CM

## 2018-08-06 DIAGNOSIS — I2510 Atherosclerotic heart disease of native coronary artery without angina pectoris: Secondary | ICD-10-CM | POA: Diagnosis not present

## 2018-08-06 DIAGNOSIS — I4510 Unspecified right bundle-branch block: Secondary | ICD-10-CM

## 2018-08-06 DIAGNOSIS — I214 Non-ST elevation (NSTEMI) myocardial infarction: Secondary | ICD-10-CM | POA: Diagnosis not present

## 2018-08-06 NOTE — Progress Notes (Signed)
Daily Session Note  Patient Details  Name: Matthew Nunez MRN: [id number] Date of Birth: [birth year] Referring Provider:   Flowsheet Row CARDIAC REHAB PHASE II ORIENTATION from 07/23/2018 in [place]  Referring Provider  Candee Furbish MD      Encounter Date: 08/05/2018  Check In:   Capillary Blood Glucose: No results found for this or any previous visit (from the past 24 hour(s)).    Social History   Tobacco Use  Smoking Status Never Smoker  Smokeless Tobacco Never Used    Goals Met:  Exercise tolerated well  Goals Unmet:  Not Applicable  Comments: Pt started cardiac rehab today.  Pt tolerated light exercise without difficulty. VSS, telemetry-sinus rhythm,  asymptomatic.  Medication list reconciled. Pt denies barriers to medicaiton compliance.  PSYCHOSOCIAL ASSESSMENT:  PHQ-0. Pt exhibits positive coping skills, hopeful outlook with supportive family. No psychosocial needs identified at this time, no psychosocial interventions necessary.  Pt oriented to exercise equipment and routine.    Understanding verbalized.  Andi Hence, RN, BSN Cardiac Pulmonary Rehab    Dr. Fransico Him is Medical Director for Cardiac Rehab at [facility].

## 2018-08-06 NOTE — Patient Instructions (Signed)

## 2018-08-06 NOTE — Progress Notes (Signed)
Cardiology Office Note    Date:  08/06/2018   ID:  Matthew Nunez, DOB 1946-06-29, MRN [id number]  PCP:  Lawerance Cruel, MD  Cardiologist:   Candee Furbish, MD     History of Present Illness:  Matthew Nunez is a 72 y.o. male here for evaluation of second-degree heart block, irregular rhythm at the request of Dr. Melinda Crutch. He had an EKG performed on 07/25/16 as part of the Medicare physical which showed second-degree heart block. He is not describing any particular symptoms with this, no shortness of breath, no chest pain, no orthopnea, no PND.  In the past, he has had leg vein stripping. Has Pro long leg swelling left leg. Varicose veins will be examined on 10/08/16 by Dr. Donnetta Hutching. He previously seen somebody in [place] where his daughter is currently residing.  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 Enjoying this.  08/06/2018- back on 04/29/2018 she was admitted with non-ST elevation myocardial infarction and had a DES to RCA.  Mild troponin increase.  She has been on aspirin and Brilinta however we are here today considering stopping the aspirin and continuing the Brilinta per Dr. Ellyn Hack at today's visit.  Felt like GERD over weekend. [employer]. Felt off. TUMS. Sunday afternoon, walking dog, felt SOB, tired. Monday. Dr. Leonides Schanz. Went to [facility] - Trop drawn, abnormal. 911.   Overall he has been doing well.  About to start cardiac rehab.  No bleeding.  Feels well.  EF on echocardiogram was reassuring.   Past Medical History:  Diagnosis Date  . Coronary artery disease   . Dysthymia   . Mobitz type 1 second degree AV block    history of   . NSTEMI (non-ST elevated myocardial infarction) ([place])    04/30/18 PCI/DES to the mRCA, normal EF  . Varicose veins of legs     Past Surgical History:  Procedure Laterality Date  . CORONARY STENT INTERVENTION N/A 04/30/2018   Procedure: CORONARY STENT INTERVENTION;  Surgeon: Leonie Man, MD;  Location: [facility];  Service: Cardiovascular;  Laterality: N/A;  . HERNIA REPAIR    . LEFT HEART CATH AND CORONARY ANGIOGRAPHY N/A 04/30/2018   Procedure: LEFT HEART CATH AND CORONARY ANGIOGRAPHY;  Surgeon: Leonie Man, MD;  Location: [facility];  Service: Cardiovascular;  Laterality: N/A;  . VERICOSE VEIN SURGERY  2000    Current Medications: Outpatient Medications Prior to Visit  Medication Sig Dispense Refill  . aspirin EC 81 MG tablet Take 81 mg by mouth daily.     Marland Kitchen atorvastatin (LIPITOR) 80 MG tablet Take 1 tablet (80 mg total) by mouth daily at 6 PM. 90 tablet 1  . metoprolol tartrate (LOPRESSOR) 25 MG tablet Take 0.5 tablets (12.5 mg total) by mouth 2 (two) times daily. 60 tablet 2  . nitroGLYCERIN (NITROSTAT) 0.4 MG SL tablet Place 1 tablet (0.4 mg total) under the tongue every 5 (five) minutes x 3 doses as needed for chest pain. 25 tablet 1  . ticagrelor (BRILINTA) 90 MG TABS tablet Take 1 tablet (90 mg total) by mouth 2 (two) times daily. 180 tablet 2   No facility-administered medications prior to visit.      Allergies:   Patient has no known allergies.   Social History   Socioeconomic History  . Marital status: Married    Spouse name: Not on file  . Number of children: Not on file  . Years of education:  Not on file  . Highest education level: Not on file  Occupational History  . Not on file  Social Needs  . Financial resource strain: Not on file  . Food insecurity:    Worry: Not on file    Inability: Not on file  . Transportation needs:    Medical: Not on file    Non-medical: Not on file  Tobacco Use  . Smoking status: Never Smoker  . Smokeless tobacco: Never Used  Substance and Sexual Activity  . Alcohol use: Yes    Comment: 2 DRINKS A WEEK  . Drug use: No  . Sexual activity: Yes    Comment: MARRIED  Lifestyle  . Physical activity:    Days per week: Not on file    Minutes per session: Not on file  . Stress: Not on file  Relationships  .  Social connections:    Talks on phone: Not on file    Gets together: Not on file    Attends religious service: Not on file    Active member of club or organization: Not on file    Attends meetings of clubs or organizations: Not on file    Relationship status: Not on file  Other Topics Concern  . Not on file  Social History Narrative  . Not on file     Family History:  The patient's family history includes Alzheimer's disease in his father; Cirrhosis in his brother; Healthy in his sister.   ROS:   Please see the history of present illness.    Review of Systems  All other systems reviewed and are negative.   PHYSICAL EXAM:   VS:  BP 104/60   Pulse 60   Ht 6' 0.75" (1.848 m)   Wt 217 lb (98.4 kg)   SpO2 96%   BMI 28.83 kg/m    GEN: Well nourished, well developed, in no acute distress  HEENT: normal  Neck: no JVD, carotid bruits, or masses Cardiac: RRR; no murmurs, rubs, or gallops,no edema  Respiratory:  clear to auscultation bilaterally, normal work of breathing GI: soft, nontender, nondistended, + BS MS: no deformity or atrophy  Skin: warm and dry, no rash Neuro:  Alert and Oriented x 3, Strength and sensation are intact Psych: euthymic mood, full affect   Wt Readings from Last 3 Encounters:  08/06/18 217 lb (98.4 kg)  07/23/18 222 lb 14.2 oz (101.1 kg)  05/08/18 232 lb (105.2 kg)      Studies/Labs Reviewed:   EKG: 04/2018-subtle ST elevation noted in inferior leads in the setting of RCA infarct 07/25/16-prior EKG shows sinus rhythm, right bundle branch block, prolongation of PR interval with subsequent drop consistent with second-degree heart block type I, Wenkebach.   Recent Labs: 05/01/2018: Hemoglobin 15.8; Platelets 186 06/07/2018: ALT 28; BUN 14; Creatinine, Ser 0.82; Magnesium 1.9; Potassium 4.5; Sodium 138   Lipid Panel    Component Value Date/Time   CHOL 100 06/07/2018 0913   TRIG 47 06/07/2018 0913   HDL 43 06/07/2018 0913   CHOLHDL 2.3 06/07/2018 0913    CHOLHDL 3.1 05/01/2018 0226   VLDL 12 05/01/2018 0226   LDLCALC 48 06/07/2018 0913    Additional studies/ records that were reviewed today include:  Prior records, labs, office notes, EKGs reviewed.  04/30/18 CORONARY STENT INTERVENTION  LEFT HEART CATH AND CORONARY ANGIOGRAPHY  Conclusion     Mid RCA lesion is 99% stenosed.  A drug-eluting stent was successfully placed using a STENT SYNERGY DES 2.75X24. Postdilated  to 2.85-2.9 mm  Post intervention, there is a 0% residual stenosis.  Mid LAD lesion is 45% stenosed -long eccentric lesion.  There is mild left ventricular systolic dysfunction.  LV end diastolic pressure is moderately elevated. The left ventricular ejection fraction is 35-45% by visual estimate.  Severe single-vessel disease with 99% stenosis in mid RCA treated with a single DES stent Mild to moderately reduced EF with moderately elevated EDP  Plan: Monitor overnight and anticipate discharge tomorrow  TR band removal per protocol  Continue aggressive risk factor modification  Dual antibiotic therapy for minimum of 3 months, could then stop aspirin but would continue Brilinta for minimum 1 year.    Glenetta Hew, M.D., M.S.   05/01/18: ECHO  - Left ventricle: The cavity size was normal. Wall thickness was   increased in a pattern of mild LVH. Systolic function was normal.   The estimated ejection fraction was in the range of 55% to 60%.   Wall motion was normal; there were no regional wall motion   abnormalities. Doppler parameters are consistent with abnormal   left ventricular relaxation (grade 1 diastolic dysfunction). The   E/e&' ratio is between 8-15, suggesting indeterminate LV filling   pressure. - Mitral valve: Calcified annulus. Mildly thickened leaflets .   There was mild regurgitation. - Left atrium: The atrium was normal in size. - Right atrium: The atrium was mildly dilated. - Inferior vena cava: The vessel was dilated. The  respirophasic   diameter changes were blunted (< 50%), consistent with elevated   central venous pressure.  Impressions:  - Compared to a prior study in 2017, there are few changes.   ASSESSMENT:    1. NSTEMI (non-ST elevated myocardial infarction) ([place])   2. Mixed hyperlipidemia   3. Right bundle branch block   4. Coronary artery disease involving native coronary artery of native heart without angina pectoris      PLAN:  In order of problems listed above:  CAD post non-ST elevation myocardial infarction 04/30/2018 - RCA DES mid 99%. -Continue with aggressive secondary prevention.  Continue with Brilinta and aspirin.  Low-dose metoprolol.  EF normal on echocardiogram.  Cardiac rehab about to start.  Watch for any symptoms.  Nitroglycerin as needed.  He knows not to take Viagra and nitroglycerin.  If dizziness were to occur, stop.  Second degree heart block type I  - Wenkebach phenomenon.  - Avoid beta blockers and calcium channel blockers.  - No high-risk symptoms such as syncope.  - Explained the physiology.  - This lesion does not require pacemaker.  - If symptoms of shortness of breath, lethargy, syncope, significant bradycardia occur, he may require pacemaker in the future.  - We will keep an eye on him.  No changes.  Right bundle branch block  - Fibrosis.  - Checking echocardiogram.  - Conduction abnormalities noted as above. No symptoms.  Stable.  Varicose veins  - Hereditary. Previously seen Dr. at [facility].  - Had a consultation with Dr. Donnetta Hutching.  He will continue to monitor him.  Thrombosis throughout the anterior accessory saphenous vein and varicosities on left but no further treatment other than conservative management.  Continued observation.    Medication Adjustments/Labs and Tests Ordered: Current medicines are reviewed at length with the patient today.  Concerns regarding medicines are outlined above.  Medication changes, Labs and Tests ordered today are listed  in the Patient Instructions below. Patient Instructions  Medication Instructions:  The current medical regimen is effective;  continue  present plan and medications.  Follow-Up: Follow up in 6 months with Dr. Marlou Porch.  You will receive a letter in the mail 2 months before you are due.  Please call us when you receive this letter to schedule your follow up appointment.  If you need a refill on your cardiac medications before your next appointment, please call your pharmacy.  Thank you for choosing [facility]!!        Signed, Candee Furbish, MD  08/06/2018 9:42 AM    [facility] [place], [place], [place]  [address] Phone: [phone]; Fax: [phone]

## 2018-08-07 ENCOUNTER — Encounter (HOSPITAL_COMMUNITY)
Admission: RE | Admit: 2018-08-07 | Discharge: 2018-08-07 | Disposition: A | Discharge status: Home / Self Care | Payer: Medicare Other | Source: Ambulatory Visit | Attending: Cardiology | Admitting: Cardiology

## 2018-08-07 ENCOUNTER — Encounter (HOSPITAL_COMMUNITY): Payer: Medicare Other

## 2018-08-07 DIAGNOSIS — I214 Non-ST elevation (NSTEMI) myocardial infarction: Secondary | ICD-10-CM | POA: Diagnosis not present

## 2018-08-07 DIAGNOSIS — Z955 Presence of coronary angioplasty implant and graft: Secondary | ICD-10-CM | POA: Diagnosis not present

## 2018-08-09 ENCOUNTER — Encounter (HOSPITAL_COMMUNITY): Payer: Medicare Other

## 2018-08-09 ENCOUNTER — Encounter (HOSPITAL_COMMUNITY)
Admission: RE | Admit: 2018-08-09 | Discharge: 2018-08-09 | Disposition: A | Discharge status: Home / Self Care | Payer: Medicare Other | Source: Ambulatory Visit | Attending: Cardiology | Admitting: Cardiology

## 2018-08-09 DIAGNOSIS — I214 Non-ST elevation (NSTEMI) myocardial infarction: Secondary | ICD-10-CM | POA: Diagnosis not present

## 2018-08-09 DIAGNOSIS — Z955 Presence of coronary angioplasty implant and graft: Secondary | ICD-10-CM

## 2018-08-12 ENCOUNTER — Encounter (HOSPITAL_COMMUNITY): Payer: Medicare Other

## 2018-08-12 ENCOUNTER — Encounter (HOSPITAL_COMMUNITY)
Admission: RE | Admit: 2018-08-12 | Discharge: 2018-08-12 | Disposition: A | Discharge status: Home / Self Care | Payer: Medicare Other | Source: Ambulatory Visit | Attending: Cardiology | Admitting: Cardiology

## 2018-08-12 DIAGNOSIS — Z955 Presence of coronary angioplasty implant and graft: Secondary | ICD-10-CM | POA: Diagnosis not present

## 2018-08-12 DIAGNOSIS — I214 Non-ST elevation (NSTEMI) myocardial infarction: Secondary | ICD-10-CM | POA: Diagnosis not present

## 2018-08-14 ENCOUNTER — Encounter (HOSPITAL_COMMUNITY): Payer: Medicare Other

## 2018-08-14 ENCOUNTER — Encounter (HOSPITAL_COMMUNITY)
Admission: RE | Admit: 2018-08-14 | Discharge: 2018-08-14 | Disposition: A | Discharge status: Home / Self Care | Payer: Medicare Other | Source: Ambulatory Visit | Attending: Cardiology | Admitting: Cardiology

## 2018-08-14 DIAGNOSIS — Z955 Presence of coronary angioplasty implant and graft: Secondary | ICD-10-CM

## 2018-08-14 DIAGNOSIS — I214 Non-ST elevation (NSTEMI) myocardial infarction: Secondary | ICD-10-CM

## 2018-08-16 ENCOUNTER — Encounter (HOSPITAL_COMMUNITY): Payer: Medicare Other

## 2018-08-16 ENCOUNTER — Encounter (HOSPITAL_COMMUNITY)
Admission: RE | Admit: 2018-08-16 | Discharge: 2018-08-16 | Disposition: A | Discharge status: Home / Self Care | Payer: Medicare Other | Source: Ambulatory Visit | Attending: Cardiology | Admitting: Cardiology

## 2018-08-16 VITALS — Ht 72.75 in | Wt 220.7 lb

## 2018-08-16 DIAGNOSIS — I214 Non-ST elevation (NSTEMI) myocardial infarction: Secondary | ICD-10-CM | POA: Diagnosis not present

## 2018-08-16 DIAGNOSIS — Z955 Presence of coronary angioplasty implant and graft: Secondary | ICD-10-CM

## 2018-08-16 NOTE — Progress Notes (Signed)
Matthew Nunez 72 y.o. male DOB: 02/06/1946 MRN: [id number]      Nutrition Note  1. 04/29/2018 NSTEMI (non-ST elevated myocardial infarction) ([place])   2. 04/29/2018 Stented coronary artery    Past Medical History:  Diagnosis Date  . Coronary artery disease   . Dysthymia   . Mobitz type 1 second degree AV block    history of   . NSTEMI (non-ST elevated myocardial infarction) ([place])    04/30/18 PCI/DES to the mRCA, normal EF  . Varicose veins of legs    Meds reviewed. Lipitor, lopressor noted  HT: Ht Readings from Last 1 Encounters:  08/06/18 6' 0.75" (1.848 m)    WT: Wt Readings from Last 5 Encounters:  08/06/18 217 lb (98.4 kg)  07/23/18 222 lb 14.2 oz (101.1 kg)  05/08/18 232 lb (105.2 kg)  05/01/18 235 lb 3.7 oz (106.7 kg)  02/06/17 230 lb 3.2 oz (104.4 kg)     There is no height or weight on file to calculate BMI.   Current tobacco use? No   Labs:  Lipid Panel     Component Value Date/Time   CHOL 100 06/07/2018 0913   TRIG 47 06/07/2018 0913   HDL 43 06/07/2018 0913   CHOLHDL 2.3 06/07/2018 0913   CHOLHDL 3.1 05/01/2018 0226   VLDL 12 05/01/2018 0226   LDLCALC 48 06/07/2018 0913    No results found for: HGBA1C CBG (last 3)  No results for input(s): GLUCAP in the last 72 hours.  Nutrition Note Spoke with pt. Nutrition plan and goals reviewed with pt. Pt is following Step 2 of the Therapeutic Lifestyle Changes diet. Pt wants to maintain weight. Has intentionally changed his diet and lost 20 lbs as a result, by increasing vegetable intake, decreasing portion sizes, and eating out less frequently. Is currently very happy with his weight. Per discussion, pt does not use canned/convenience foods often. Pt rarely adds salt to food. Pt eats out infrequently. Wife does all of the cooking and has switched household to heart healthy diet. Pt expressed understanding of the information reviewed. Pt aware of nutrition education classes offered and plans on attending nutrition  classes.  Nutrition Diagnosis ? Food-and nutrition-related knowledge deficit related to lack of exposure to information as related to diagnosis of: ? CVD   Nutrition Intervention ? Pt's individual nutrition plan and goals reviewed with pt. ? Pt given handouts for: ? Nutrition I class ? Nutrition II class  ? Diabetes Blitz Class ? Diabetes Q & A class  ? Consistent vit K diet ? low sodium ? DM ? pre-diabetes  Nutrition Goal(s):   ? Pt to identify and limit food sources of saturated fat, trans fat, refined carbohydrates and sodium ? Pt to use a heart healthy oil or other heart healthy recipe modifications instead of margarine, lard, butter, or shortening for baking.    Plan:  ? Pt to attend nutrition classes ? Nutrition I ? Nutrition II ? Portion Distortion  ? Will provide client-centered nutrition education as part of interdisciplinary care ? Monitor and evaluate progress toward nutrition goal with team.   Laurina Bustle, MS, RD, LDN 08/16/2018 9:28 AM

## 2018-08-16 NOTE — Progress Notes (Signed)
Reviewed home exercise with pt today.  Pt plans to walk for exercise, 2-3x/week in addition to coming to cardiac rehab.  Reviewed THR, pulse, RPE, sign and symptoms, NTG use, and when to call 911 or MD.  Also discussed weather considerations and indoor options.  Pt voiced understanding.    Copeland Lapier,MS,ACSM RCEP 

## 2018-08-19 ENCOUNTER — Encounter (HOSPITAL_COMMUNITY): Payer: Medicare Other

## 2018-08-19 ENCOUNTER — Encounter (HOSPITAL_COMMUNITY)
Admission: RE | Admit: 2018-08-19 | Discharge: 2018-08-19 | Disposition: A | Discharge status: Home / Self Care | Payer: Medicare Other | Source: Ambulatory Visit | Attending: Cardiology | Admitting: Cardiology

## 2018-08-19 DIAGNOSIS — I214 Non-ST elevation (NSTEMI) myocardial infarction: Secondary | ICD-10-CM

## 2018-08-19 DIAGNOSIS — Z955 Presence of coronary angioplasty implant and graft: Secondary | ICD-10-CM | POA: Diagnosis not present

## 2018-08-21 ENCOUNTER — Encounter (HOSPITAL_COMMUNITY): Payer: Medicare Other

## 2018-08-21 ENCOUNTER — Encounter (HOSPITAL_COMMUNITY)
Admission: RE | Admit: 2018-08-21 | Discharge: 2018-08-21 | Disposition: A | Discharge status: Home / Self Care | Payer: Medicare Other | Source: Ambulatory Visit | Attending: Cardiology | Admitting: Cardiology

## 2018-08-21 DIAGNOSIS — I214 Non-ST elevation (NSTEMI) myocardial infarction: Secondary | ICD-10-CM | POA: Diagnosis not present

## 2018-08-21 DIAGNOSIS — Z955 Presence of coronary angioplasty implant and graft: Secondary | ICD-10-CM | POA: Diagnosis not present

## 2018-08-23 ENCOUNTER — Encounter (HOSPITAL_COMMUNITY)
Admission: RE | Admit: 2018-08-23 | Discharge: 2018-08-23 | Disposition: A | Discharge status: Home / Self Care | Payer: Medicare Other | Source: Ambulatory Visit | Attending: Cardiology | Admitting: Cardiology

## 2018-08-23 ENCOUNTER — Encounter (HOSPITAL_COMMUNITY): Payer: Medicare Other

## 2018-08-23 DIAGNOSIS — Z955 Presence of coronary angioplasty implant and graft: Secondary | ICD-10-CM | POA: Diagnosis not present

## 2018-08-23 DIAGNOSIS — I214 Non-ST elevation (NSTEMI) myocardial infarction: Secondary | ICD-10-CM

## 2018-08-28 ENCOUNTER — Encounter (HOSPITAL_COMMUNITY): Payer: Medicare Other

## 2018-08-28 ENCOUNTER — Encounter (HOSPITAL_COMMUNITY)
Admission: RE | Admit: 2018-08-28 | Discharge: 2018-08-28 | Disposition: A | Discharge status: Home / Self Care | Payer: Medicare Other | Source: Ambulatory Visit | Attending: Cardiology | Admitting: Cardiology

## 2018-08-28 DIAGNOSIS — I214 Non-ST elevation (NSTEMI) myocardial infarction: Secondary | ICD-10-CM

## 2018-08-28 DIAGNOSIS — Z955 Presence of coronary angioplasty implant and graft: Secondary | ICD-10-CM | POA: Insufficient documentation

## 2018-08-29 ENCOUNTER — Encounter (HOSPITAL_COMMUNITY): Payer: Self-pay

## 2018-08-29 NOTE — Progress Notes (Signed)
Cardiac Individual Treatment Plan  Patient Details  Name: Matthew Nunez MRN: [id number] Date of Birth: 1946-01-24 Referring Provider:   Flowsheet Row CARDIAC REHAB PHASE II ORIENTATION from 07/23/2018 in [place]  Referring Provider  Candee Furbish MD      Initial Encounter Date:  [place] from 07/23/2018 in [place]  Date  07/23/18      Visit Diagnosis: 04/29/2018 NSTEMI (non-ST elevated myocardial infarction) ([place])  04/29/2018 Stented coronary artery  Patient's Home Medications on Admission:  Current Outpatient Medications:  .  aspirin EC 81 MG tablet, Take 81 mg by mouth daily. , Disp: , Rfl:  .  atorvastatin (LIPITOR) 80 MG tablet, Take 1 tablet (80 mg total) by mouth daily at 6 PM., Disp: 90 tablet, Rfl: 1 .  metoprolol tartrate (LOPRESSOR) 25 MG tablet, Take 0.5 tablets (12.5 mg total) by mouth 2 (two) times daily., Disp: 60 tablet, Rfl: 2 .  nitroGLYCERIN (NITROSTAT) 0.4 MG SL tablet, Place 1 tablet (0.4 mg total) under the tongue every 5 (five) minutes x 3 doses as needed for chest pain., Disp: 25 tablet, Rfl: 1 .  ticagrelor (BRILINTA) 90 MG TABS tablet, Take 1 tablet (90 mg total) by mouth 2 (two) times daily., Disp: 180 tablet, Rfl: 2  Past Medical History: Past Medical History:  Diagnosis Date  . Coronary artery disease   . Dysthymia   . Mobitz type 1 second degree AV block    history of   . NSTEMI (non-ST elevated myocardial infarction) ([place])    04/30/18 PCI/DES to the mRCA, normal EF  . Varicose veins of legs     Tobacco Use: Social History   Tobacco Use  Smoking Status Never Smoker  Smokeless Tobacco Never Used    Labs: Recent Review Scientist, physiological    Labs for ITP Cardiac and Pulmonary Rehab Latest Ref Rng & Units 05/01/2018 06/07/2018   Cholestrol 100 - 199 mg/dL 173 100   LDLCALC 0 - 99 mg/dL 105(H) 48   HDL >39 mg/dL 56 43   Trlycerides 0 -  149 mg/dL 62 47      Capillary Blood Glucose: No results found for: GLUCAP   Exercise Target Goals: Exercise Program Goal: Individual exercise prescription set using results from initial 6 min walk test and THRR while considering  patient's activity barriers and safety.   Exercise Prescription Goal: Initial exercise prescription builds to 30-45 minutes a day of aerobic activity, 2-3 days per week.  Home exercise guidelines will be given to patient during program as part of exercise prescription that the participant will acknowledge.  Activity Barriers & Risk Stratification: Activity Barriers & Cardiac Risk Stratification - 07/23/18 0838    Activity Barriers & Cardiac Risk Stratification          Activity Barriers  Deconditioning;Muscular Weakness    Cardiac Risk Stratification  High           6 Minute Walk: 6 Minute Walk    6 Minute Walk    Row Name 07/23/18 1101 07/23/18 1125   Phase  Initial  no documentation   Distance  1747 feet  no documentation   Walk Time  6 minutes  no documentation   # of Rest Breaks  0  no documentation   MPH  3.3  3.3   METS  3.4  3.6   RPE  9  no documentation   VO2 Peak  11.9  12.6   Symptoms  No  no documentation   Resting HR  69 bpm  no documentation   Resting BP  138/64  no documentation   Resting Oxygen Saturation   95 %  no documentation   Exercise Oxygen Saturation  during 6 min walk  98 %  no documentation   Max Ex. HR  87 bpm  no documentation   Max Ex. BP  142/80  no documentation   2 Minute Post BP  130/70  no documentation          Oxygen Initial Assessment:   Oxygen Re-Evaluation:   Oxygen Discharge (Final Oxygen Re-Evaluation):   Initial Exercise Prescription: Initial Exercise Prescription - 07/23/18 1100    Date of Initial Exercise RX and Referring Provider          Date  07/23/18    Referring Provider  Candee Furbish MD        Treadmill          MPH  --    Grade  --    Minutes  --    METs  --         Bike          Level  0.8    Minutes  10    METs  2.51        NuStep          Level  3    SPM  80    Minutes  10    METs  2.5        Track          Laps  12    Minutes  10    METs  3.09        Prescription Details          Frequency (times per week)  3    Duration  Progress to 30 minutes of continuous aerobic without signs/symptoms of physical distress        Intensity          THRR 40-80% of Max Heartrate  60-121    Ratings of Perceived Exertion  11-13    Perceived Dyspnea  0-4        Progression          Progression  Continue to progress workloads to maintain intensity without signs/symptoms of physical distress.        Resistance Training          Training Prescription  Yes    Weight  4lbs    Reps  10-15           Perform Capillary Blood Glucose checks as needed.  Exercise Prescription Changes: Exercise Prescription Changes    Response to Exercise    Row Name 08/05/18 1500 08/23/18 1413   Blood Pressure (Admit)  124/76  120/80   Blood Pressure (Exercise)  144/80  110/60   Blood Pressure (Exit)  114/82  120/60   Heart Rate (Admit)  66 bpm  60 bpm   Heart Rate (Exercise)  90 bpm  105 bpm   Heart Rate (Exit)  60 bpm  60 bpm   Rating of Perceived Exertion (Exercise)  12  14   Perceived Dyspnea (Exercise)  no documentation  0   Symptoms  none  None    Comments  pt was oriented to exercise equipment today  no documentation   Duration  Continue with 30 min of aerobic exercise without signs/symptoms of physical distress.  Continue with 30 min of aerobic exercise without signs/symptoms of physical distress.   Intensity  THRR unchanged  THRR unchanged       Progression    Row Name 08/05/18 1500 08/23/18 1413   Progression  Continue to progress workloads to maintain intensity without signs/symptoms of physical distress.  Continue to progress workloads to maintain intensity without signs/symptoms of physical distress.   Average METs  2.6  3.6        Resistance Training    Row Name 08/05/18 1500 08/23/18 1413   Training Prescription  Yes  Yes   Weight  4lbs  4lbs   Reps  10-15  10-15   Time  10 Minutes  [place] Name 08/05/18 1500 08/23/18 1413   Level  0.8  2   Minutes  10  10   METs  2.49  4.58       NuStep    Row Name 08/05/18 1500 08/23/18 1413   Level  3  5   SPM  80  85   Minutes  10  10   METs  2.6  2.91       Track    Row Name 08/05/18 1500 08/23/18 1413   Laps  10  14   Minutes  10  10   METs  2.74  3.45       Home Exercise Plan    Row Name 08/05/18 1500 08/23/18 1413   Plans to continue exercise at  no documentation  Home (comment) Walking   Frequency  no documentation  Add 2 additional days to program exercise sessions.   Initial Home Exercises Provided  no documentation  08/16/18          Exercise Comments: Exercise Comments    Row Name 08/05/18 1535 08/16/18 1127   Exercise Comments  Pt completed first session of cardiac rehab without signs and symptoms of physical distress.   Reviewed METs and goals. Pt is off to a great start and has a great understanding of Ex Rx.       Exercise Goals and Review: Exercise Goals    Exercise Goals    Row Name 07/23/18 0839   Increase Physical Activity  Yes   Intervention  Provide advice, education, support and counseling about physical activity/exercise needs.;Develop an individualized exercise prescription for aerobic and resistive training based on initial evaluation findings, risk stratification, comorbidities and participant's personal goals.   Expected Outcomes  Short Term: Attend rehab on a regular basis to increase amount of physical activity.;Long Term: Exercising regularly at least 3-5 days a week.;Long Term: Add in home exercise to make exercise part of routine and to increase amount of physical activity.   Increase Strength and Stamina  Yes   Intervention  Provide advice, education, support and counseling about physical  activity/exercise needs.;Develop an individualized exercise prescription for aerobic and resistive training based on initial evaluation findings, risk stratification, comorbidities and participant's personal goals.   Expected Outcomes  Short Term: Increase workloads from initial exercise prescription for resistance, speed, and METs.;Short Term: Perform resistance training exercises routinely during rehab and add in resistance training at home;Long Term: Improve cardiorespiratory fitness, muscular endurance and strength as measured by increased METs and functional capacity (6MWT)   Able to understand and use rate of perceived exertion (RPE) scale  Yes   Intervention  Provide education and explanation on how to use RPE scale   Expected Outcomes  Short Term: Able to use RPE daily in rehab to express subjective intensity level;Long Term:  Able to use RPE to guide intensity level when exercising independently   Knowledge and understanding of Target Heart Rate Range (THRR)  Yes   Intervention  Provide education and explanation of THRR including how the numbers were predicted and where they are located for reference   Expected Outcomes  Short Term: Able to state/look up THRR;Long Term: Able to use THRR to govern intensity when exercising independently;Short Term: Able to use daily as guideline for intensity in rehab   Able to check pulse independently  Yes   Intervention  Provide education and demonstration on how to check pulse in carotid and radial arteries.;Review the importance of being able to check your own pulse for safety during independent exercise   Expected Outcomes  Short Term: Able to explain why pulse checking is important during independent exercise;Long Term: Able to check pulse independently and accurately   Understanding of Exercise Prescription  Yes   Intervention  Provide education, explanation, and written materials on patient's individual exercise prescription   Expected Outcomes  Short  Term: Able to explain program exercise prescription;Long Term: Able to explain home exercise prescription to exercise independently          Exercise Goals Re-Evaluation : Exercise Goals Re-Evaluation    Exercise Goal Re-Evaluation    Row Name 08/16/18 1128   Exercise Goals Review  Increase Physical Activity;Able to understand and use rate of perceived exertion (RPE) scale;Knowledge and understanding of Target Heart Rate Range (THRR);Understanding of Exercise Prescription;Increase Strength and Stamina;Able to check pulse independently   Comments  Pt is progressing well in cardiac rehab. Pt stated no change in strength and stamina due to only being in the program for a full week. Expressed to patient the importance of compliance with activity and making exercise a proiority. Also reviewed home exercise program.   Expected Outcomes  Pt will be compliant with HEP in which it will assist with improving overall cardiovascular fitness          Discharge Exercise Prescription (Final Exercise Prescription Changes): Exercise Prescription Changes - 08/23/18 1413    Response to Exercise          Blood Pressure (Admit)  120/80    Blood Pressure (Exercise)  110/60    Blood Pressure (Exit)  120/60    Heart Rate (Admit)  60 bpm    Heart Rate (Exercise)  105 bpm    Heart Rate (Exit)  60 bpm    Rating of Perceived Exertion (Exercise)  14    Perceived Dyspnea (Exercise)  0    Symptoms  None     Duration  Continue with 30 min of aerobic exercise without signs/symptoms of physical distress.    Intensity  THRR unchanged        Progression          Progression  Continue to progress workloads to maintain intensity without signs/symptoms of physical distress.    Average METs  3.6        Resistance Training          Training Prescription  Yes    Weight  4lbs    Reps  10-15    Time  10 Minutes        Bike          Level  2    Minutes  10    METs  4.58  NuStep          Level  5     SPM  85    Minutes  10    METs  2.91        Track          Laps  14    Minutes  10    METs  3.45        Home Exercise Plan          Plans to continue exercise at  Home (comment)   Walking   Frequency  Add 2 additional days to program exercise sessions.    Initial Home Exercises Provided  08/16/18           Nutrition:  Target Goals: Understanding of nutrition guidelines, daily intake of sodium 1500mg , cholesterol 200mg , calories 30% from fat and 7% or less from saturated fats, daily to have 5 or more servings of fruits and vegetables.  Biometrics: Pre Biometrics - 07/23/18 1127    Pre Biometrics          Height  6' 0.75" (1.848 m)    Weight  101.1 kg    Waist Circumference  42 inches    Hip Circumference  46.5 inches    Waist to Hip Ratio  0.9 %    BMI (Calculated)  29.6    Triceps Skinfold  13 mm    % Body Fat  28.1 %    Grip Strength  55 kg    Flexibility  10.25 in    Single Leg Stand  16.59 seconds            Nutrition Therapy Plan and Nutrition Goals: Nutrition Therapy & Goals - 08/16/18 0934    Nutrition Therapy          Diet  heart healthy        Personal Nutrition Goals          Nutrition Goal  Pt to identify and limit food sources of saturated fat, trans fat, and sodium    Personal Goal #2  Pt to use a heart healthy oil or other heart healthy recipe modifications instead of margarine, lard, butter, or shortening for baking        Intervention Plan          Intervention  Prescribe, educate and counsel regarding individualized specific dietary modifications aiming towards targeted core components such as weight, hypertension, lipid management, diabetes, heart failure and other comorbidities.    Expected Outcomes  Short Term Goal: Understand basic principles of dietary content, such as calories, fat, sodium, cholesterol and nutrients.           Nutrition Assessments: Nutrition Assessments - 07/23/18 1047    MEDFICTS Scores          Pre  Score  27           Nutrition Goals Re-Evaluation:   Nutrition Goals Re-Evaluation:   Nutrition Goals Discharge (Final Nutrition Goals Re-Evaluation):   Psychosocial: Target Goals: Acknowledge presence or absence of significant depression and/or stress, maximize coping skills, provide positive support system. Participant is able to verbalize types and ability to use techniques and skills needed for reducing stress and depression.  Initial Review & Psychosocial Screening: Initial Psych Review & Screening - 07/23/18 1118    Initial Review          Current issues with  None Identified        Family Dynamics  Good Support System?  Yes   wife, daughter, son        Barriers          Psychosocial barriers to participate in program  There are no identifiable barriers or psychosocial needs.        Screening Interventions          Interventions  Encouraged to exercise           Quality of Life Scores: Quality of Life - 07/23/18 1121    Quality of Life          Select  Quality of Life        Quality of Life Scores          Health/Function Pre  27.3 %    Socioeconomic Pre  24.1 %    Psych/Spiritual Pre  26.4 %    Family Pre  26.4 %    GLOBAL Pre  26.3 %          Scores of 19 and below usually indicate a poorer quality of life in these areas.  A difference of  2-3 points is a clinically meaningful difference.  A difference of 2-3 points in the total score of the Quality of Life Index has been associated with significant improvement in overall quality of life, self-image, physical symptoms, and general health in studies assessing change in quality of life.  PHQ-9: Recent Review Flowsheet Data    Depression screen [facility] 2/9 08/06/2018   Decreased Interest 0   Down, Depressed, Hopeless 0   PHQ - 2 Score 0     Interpretation of Total Score  Total Score Depression Severity:  1-4 = Minimal depression, 5-9 = Mild depression, 10-14 = Moderate depression,  15-19 = Moderately severe depression, 20-27 = Severe depression   Psychosocial Evaluation and Intervention: Psychosocial Evaluation - 08/06/18 1532    Psychosocial Evaluation & Interventions          Comments  no psychosocial needs identified, no interventions necessary.  pt enjoys swimming and walking his dog.     Expected Outcomes  pt will exhibit positive outlook with good coping skills.     Continue Psychosocial Services   No Follow up required           Psychosocial Re-Evaluation: Psychosocial Re-Evaluation    Psychosocial Re-Evaluation    [place] Name 08/28/18 0800   Current issues with  None Identified   Comments  no psychosocial needs identified, no interventions necessary    Expected Outcomes  pt will exhibit positive outlook with good coping skills.    Interventions  Encouraged to attend Cardiac Rehabilitation for the exercise   Continue Psychosocial Services   No Follow up required          Psychosocial Discharge (Final Psychosocial Re-Evaluation): Psychosocial Re-Evaluation - 08/28/18 0800    Psychosocial Re-Evaluation          Current issues with  None Identified    Comments  no psychosocial needs identified, no interventions necessary     Expected Outcomes  pt will exhibit positive outlook with good coping skills.     Interventions  Encouraged to attend Cardiac Rehabilitation for the exercise    Continue Psychosocial Services   No Follow up required           Vocational Rehabilitation: Provide vocational rehab assistance to qualifying candidates.   Vocational Rehab Evaluation & Intervention: Vocational Rehab - 07/23/18 1118    Initial Vocational Rehab Evaluation & Intervention  Assessment shows need for Vocational Rehabilitation  No           Education: Education Goals: Education classes will be provided on a weekly basis, covering required topics. Participant will state understanding/return demonstration of topics presented.  Learning  Barriers/Preferences: Learning Barriers/Preferences - 07/23/18 0845    Learning Barriers/Preferences          Learning Barriers  Sight    Learning Preferences  Skilled Demonstration           Education Topics: Count Your Pulse:  -Group instruction provided by verbal instruction, demonstration, patient participation and written materials to support subject.  Instructors address importance of being able to find your pulse and how to count your pulse when at home without a heart monitor.  Patients get hands on experience counting their pulse with staff help and individually.   Heart Attack, Angina, and Risk Factor Modification:  -Group instruction provided by verbal instruction, video, and written materials to support subject.  Instructors address signs and symptoms of angina and heart attacks.    Also discuss risk factors for heart disease and how to make changes to improve heart health risk factors.   Functional Fitness:  -Group instruction provided by verbal instruction, demonstration, patient participation, and written materials to support subject.  Instructors address safety measures for doing things around the house.  Discuss how to get up and down off the floor, how to pick things up properly, how to safely get out of a chair without assistance, and balance training.   Meditation and Mindfulness:  -Group instruction provided by verbal instruction, patient participation, and written materials to support subject.  Instructor addresses importance of mindfulness and meditation practice to help reduce stress and improve awareness.  Instructor also leads participants through a meditation exercise.  Flowsheet Row CARDIAC REHAB PHASE II EXERCISE from 08/21/2018 in [place]  Date  08/21/18  Educator  Jeanella Craze  Instruction Review Code  2- Demonstrated Understanding      Stretching for Flexibility and Mobility:  -Group instruction provided by verbal  instruction, patient participation, and written materials to support subject.  Instructors lead participants through series of stretches that are designed to increase flexibility thus improving mobility.  These stretches are additional exercise for major muscle groups that are typically performed during regular warm up and cool down.   Hands Only CPR:  -Group verbal, video, and participation provides a basic overview of AHA guidelines for community CPR. Role-play of emergencies allow participants the opportunity to practice calling for help and chest compression technique with discussion of AED use.   Hypertension: -Group verbal and written instruction that provides a basic overview of hypertension including the most recent diagnostic guidelines, risk factor reduction with self-care instructions and medication management. Flowsheet Row CARDIAC REHAB PHASE II EXERCISE from 08/21/2018 in [place]  Date  08/16/18  Educator  RN  Instruction Review Code  2- Demonstrated Understanding       Nutrition I class: Heart Healthy Eating:  -Group instruction provided by PowerPoint slides, verbal discussion, and written materials to support subject matter. The instructor gives an explanation and review of the Therapeutic Lifestyle Changes diet recommendations, which includes a discussion on lipid goals, dietary fat, sodium, fiber, plant stanol/sterol esters, sugar, and the components of a well-balanced, healthy diet.   Nutrition II class: Lifestyle Skills:  -Group instruction provided by PowerPoint slides, verbal discussion, and written materials to support subject matter. The instructor gives an  explanation and review of label reading, grocery shopping for heart health, heart healthy recipe modifications, and ways to make healthier choices when eating out.   Diabetes Question & Answer:  -Group instruction provided by PowerPoint slides, verbal discussion, and written  materials to support subject matter. The instructor gives an explanation and review of diabetes co-morbidities, pre- and post-prandial blood glucose goals, pre-exercise blood glucose goals, signs, symptoms, and treatment of hypoglycemia and hyperglycemia, and foot care basics.   Diabetes Blitz:  -Group instruction provided by PowerPoint slides, verbal discussion, and written materials to support subject matter. The instructor gives an explanation and review of the physiology behind type 1 and type 2 diabetes, diabetes medications and rational behind using different medications, pre- and post-prandial blood glucose recommendations and Hemoglobin A1c goals, diabetes diet, and exercise including blood glucose guidelines for exercising safely.    Portion Distortion:  -Group instruction provided by PowerPoint slides, verbal discussion, written materials, and food models to support subject matter. The instructor gives an explanation of serving size versus portion size, changes in portions sizes over the last 20 years, and what consists of a serving from each food group.   Stress Management:  -Group instruction provided by verbal instruction, video, and written materials to support subject matter.  Instructors review role of stress in heart disease and how to cope with stress positively.     Exercising on Your Own:  -Group instruction provided by verbal instruction, power point, and written materials to support subject.  Instructors discuss benefits of exercise, components of exercise, frequency and intensity of exercise, and end points for exercise.  Also discuss use of nitroglycerin and activating EMS.  Review options of places to exercise outside of rehab.  Review guidelines for sex with heart disease. Flowsheet Row CARDIAC REHAB PHASE II EXERCISE from 08/21/2018 in [place]  Date  08/14/18  Educator  EP  Instruction Review Code  2- Demonstrated Understanding       Cardiac Drugs I:  -Group instruction provided by verbal instruction and written materials to support subject.  Instructor reviews cardiac drug classes: antiplatelets, anticoagulants, beta blockers, and statins.  Instructor discusses reasons, side effects, and lifestyle considerations for each drug class. Flowsheet Row CARDIAC REHAB PHASE II EXERCISE from 08/21/2018 in [place]  Date  08/07/18  Educator  pharmacist  Instruction Review Code  2- Demonstrated Understanding      Cardiac Drugs II:  -Group instruction provided by verbal instruction and written materials to support subject.  Instructor reviews cardiac drug classes: angiotensin converting enzyme inhibitors (ACE-I), angiotensin II receptor blockers (ARBs), nitrates, and calcium channel blockers.  Instructor discusses reasons, side effects, and lifestyle considerations for each drug class.   Anatomy and Physiology of the Circulatory System:  Group verbal and written instruction and models provide basic cardiac anatomy and physiology, with the coronary electrical and arterial systems. Review of: AMI, Angina, Valve disease, Heart Failure, Peripheral Artery Disease, Cardiac Arrhythmia, Pacemakers, and the ICD.   Other Education:  -Group or individual verbal, written, or video instructions that support the educational goals of the cardiac rehab program.   Holiday Eating Survival Tips:  -Group instruction provided by PowerPoint slides, verbal discussion, and written materials to support subject matter. The instructor gives patients tips, tricks, and techniques to help them not only survive but enjoy the holidays despite the onslaught of food that accompanies the holidays.   Knowledge Questionnaire Score: Knowledge Questionnaire Score - 07/23/18 1118  Knowledge Questionnaire Score          Pre Score  24/24           Core Components/Risk Factors/Patient Goals at Admission:   Core  Components/Risk Factors/Patient Goals Review:  Goals and Risk Factor Review    Core Components/Risk Factors/Patient Goals Review    Row Name 07/23/18 1141 08/28/18 0801   Personal Goals Review  Other  Other   Review  pt with no significant CAD RF.  pt demostrates eagerness to participate in CR program.    pt with no significant CAD RF.  pt demostrates eagerness to participate in CR program.  pt pleased to have regained discipline with exercise. pt is walking at home.    Expected Outcomes  pt will participate in CR exercise, nutrition and lifestyle modification opportunities.   pt will participate in CR exercise, nutrition and lifestyle modification opportunities.           Core Components/Risk Factors/Patient Goals at Discharge (Final Review):  Goals and Risk Factor Review - 08/28/18 0801    Core Components/Risk Factors/Patient Goals Review          Personal Goals Review  Other    Review  pt with no significant CAD RF.  pt demostrates eagerness to participate in CR program.  pt pleased to have regained discipline with exercise. pt is walking at home.     Expected Outcomes  pt will participate in CR exercise, nutrition and lifestyle modification opportunities.            ITP Comments: ITP Comments    Row Name 07/22/18 1052 08/06/18 1529 08/28/18 0759   ITP Comments  Dr. Fransico Him, Medical Director   pt started group exercise 08/05/2018.  pt tolerated light activity without difficulty. pt oriented to exercise equipment and safety routine.   30 day ITP review.  pt with good attendance and participation.        Comments:

## 2018-08-30 ENCOUNTER — Encounter (HOSPITAL_COMMUNITY): Payer: Medicare Other

## 2018-08-30 ENCOUNTER — Encounter (HOSPITAL_COMMUNITY)
Admission: RE | Admit: 2018-08-30 | Discharge: 2018-08-30 | Disposition: A | Discharge status: Home / Self Care | Payer: Medicare Other | Source: Ambulatory Visit | Attending: Cardiology | Admitting: Cardiology

## 2018-08-30 DIAGNOSIS — I214 Non-ST elevation (NSTEMI) myocardial infarction: Secondary | ICD-10-CM | POA: Diagnosis not present

## 2018-08-30 DIAGNOSIS — Z955 Presence of coronary angioplasty implant and graft: Secondary | ICD-10-CM

## 2018-09-02 ENCOUNTER — Encounter (HOSPITAL_COMMUNITY): Payer: Medicare Other

## 2018-09-02 ENCOUNTER — Encounter (HOSPITAL_COMMUNITY)
Admission: RE | Admit: 2018-09-02 | Discharge: 2018-09-02 | Disposition: A | Discharge status: Home / Self Care | Payer: Medicare Other | Source: Ambulatory Visit | Attending: Cardiology | Admitting: Cardiology

## 2018-09-02 DIAGNOSIS — Z955 Presence of coronary angioplasty implant and graft: Secondary | ICD-10-CM

## 2018-09-02 DIAGNOSIS — I214 Non-ST elevation (NSTEMI) myocardial infarction: Secondary | ICD-10-CM

## 2018-09-04 ENCOUNTER — Encounter (HOSPITAL_COMMUNITY): Payer: Medicare Other

## 2018-09-04 ENCOUNTER — Encounter (HOSPITAL_COMMUNITY)
Admission: RE | Admit: 2018-09-04 | Discharge: 2018-09-04 | Disposition: A | Discharge status: Home / Self Care | Payer: Medicare Other | Source: Ambulatory Visit | Attending: Cardiology | Admitting: Cardiology

## 2018-09-04 DIAGNOSIS — Z955 Presence of coronary angioplasty implant and graft: Secondary | ICD-10-CM

## 2018-09-04 DIAGNOSIS — I214 Non-ST elevation (NSTEMI) myocardial infarction: Secondary | ICD-10-CM | POA: Diagnosis not present

## 2018-09-06 ENCOUNTER — Encounter (HOSPITAL_COMMUNITY): Payer: Medicare Other

## 2018-09-06 ENCOUNTER — Encounter (HOSPITAL_COMMUNITY)
Admission: RE | Admit: 2018-09-06 | Discharge: 2018-09-06 | Disposition: A | Discharge status: Home / Self Care | Payer: Medicare Other | Source: Ambulatory Visit | Attending: Cardiology | Admitting: Cardiology

## 2018-09-06 DIAGNOSIS — I214 Non-ST elevation (NSTEMI) myocardial infarction: Secondary | ICD-10-CM | POA: Diagnosis not present

## 2018-09-06 DIAGNOSIS — Z955 Presence of coronary angioplasty implant and graft: Secondary | ICD-10-CM | POA: Diagnosis not present

## 2018-09-09 ENCOUNTER — Encounter (HOSPITAL_COMMUNITY): Payer: Medicare Other

## 2018-09-09 ENCOUNTER — Encounter (HOSPITAL_COMMUNITY)
Admission: RE | Admit: 2018-09-09 | Discharge: 2018-09-09 | Disposition: A | Discharge status: Home / Self Care | Payer: Medicare Other | Source: Ambulatory Visit | Attending: Cardiology | Admitting: Cardiology

## 2018-09-09 DIAGNOSIS — Z955 Presence of coronary angioplasty implant and graft: Secondary | ICD-10-CM | POA: Diagnosis not present

## 2018-09-09 DIAGNOSIS — I214 Non-ST elevation (NSTEMI) myocardial infarction: Secondary | ICD-10-CM | POA: Diagnosis not present

## 2018-09-11 ENCOUNTER — Encounter (HOSPITAL_COMMUNITY)
Admission: RE | Admit: 2018-09-11 | Discharge: 2018-09-11 | Disposition: A | Discharge status: Home / Self Care | Payer: Medicare Other | Source: Ambulatory Visit | Attending: Cardiology | Admitting: Cardiology

## 2018-09-11 ENCOUNTER — Encounter (HOSPITAL_COMMUNITY): Payer: Medicare Other

## 2018-09-11 DIAGNOSIS — Z955 Presence of coronary angioplasty implant and graft: Secondary | ICD-10-CM | POA: Diagnosis not present

## 2018-09-11 DIAGNOSIS — I214 Non-ST elevation (NSTEMI) myocardial infarction: Secondary | ICD-10-CM

## 2018-09-13 ENCOUNTER — Encounter (HOSPITAL_COMMUNITY)
Admission: RE | Admit: 2018-09-13 | Discharge: 2018-09-13 | Disposition: A | Discharge status: Home / Self Care | Payer: Medicare Other | Source: Ambulatory Visit | Attending: Cardiology | Admitting: Cardiology

## 2018-09-13 ENCOUNTER — Encounter (HOSPITAL_COMMUNITY): Payer: Medicare Other

## 2018-09-13 DIAGNOSIS — I214 Non-ST elevation (NSTEMI) myocardial infarction: Secondary | ICD-10-CM

## 2018-09-13 DIAGNOSIS — Z955 Presence of coronary angioplasty implant and graft: Secondary | ICD-10-CM

## 2018-09-16 ENCOUNTER — Encounter (HOSPITAL_COMMUNITY): Payer: Medicare Other

## 2018-09-16 ENCOUNTER — Encounter (HOSPITAL_COMMUNITY)
Admission: RE | Admit: 2018-09-16 | Discharge: 2018-09-16 | Disposition: A | Discharge status: Home / Self Care | Payer: Medicare Other | Source: Ambulatory Visit | Attending: Cardiology | Admitting: Cardiology

## 2018-09-16 DIAGNOSIS — I214 Non-ST elevation (NSTEMI) myocardial infarction: Secondary | ICD-10-CM

## 2018-09-16 DIAGNOSIS — Z955 Presence of coronary angioplasty implant and graft: Secondary | ICD-10-CM | POA: Diagnosis not present

## 2018-09-18 ENCOUNTER — Encounter (HOSPITAL_COMMUNITY): Payer: Medicare Other

## 2018-09-18 ENCOUNTER — Encounter (HOSPITAL_COMMUNITY)
Admission: RE | Admit: 2018-09-18 | Discharge: 2018-09-18 | Disposition: A | Discharge status: Home / Self Care | Payer: Medicare Other | Source: Ambulatory Visit | Attending: Cardiology | Admitting: Cardiology

## 2018-09-18 DIAGNOSIS — Z955 Presence of coronary angioplasty implant and graft: Secondary | ICD-10-CM | POA: Diagnosis not present

## 2018-09-18 DIAGNOSIS — I214 Non-ST elevation (NSTEMI) myocardial infarction: Secondary | ICD-10-CM | POA: Diagnosis not present

## 2018-09-20 ENCOUNTER — Encounter (HOSPITAL_COMMUNITY): Payer: Medicare Other

## 2018-09-20 ENCOUNTER — Encounter (HOSPITAL_COMMUNITY)
Admission: RE | Admit: 2018-09-20 | Discharge: 2018-09-20 | Disposition: A | Discharge status: Home / Self Care | Payer: Medicare Other | Source: Ambulatory Visit | Attending: Cardiology | Admitting: Cardiology

## 2018-09-20 DIAGNOSIS — R040 Epistaxis: Secondary | ICD-10-CM | POA: Diagnosis not present

## 2018-09-20 DIAGNOSIS — I214 Non-ST elevation (NSTEMI) myocardial infarction: Secondary | ICD-10-CM

## 2018-09-20 DIAGNOSIS — Z955 Presence of coronary angioplasty implant and graft: Secondary | ICD-10-CM | POA: Diagnosis not present

## 2018-09-20 NOTE — Progress Notes (Signed)
Daily Session Note  Patient Details  Name: Marguis Mathieson MRN: [id number] Date of Birth: Dec 12, 1946 Referring Provider:   Flowsheet Row CARDIAC REHAB PHASE II ORIENTATION from 07/23/2018 in [facility]  Referring Provider  Candee Furbish MD      Encounter Date: 09/20/2018  Check In: Session Check In - 09/20/18 0847    Check-In          Supervising physician immediately available to respond to emergencies  [facility] immediately available    Physician(s)  Dr. Jonnie Finner    Location  [facility]    Staff Present  Dorma Russell, MS,ACSM CEP, Exercise Physiologist;Joann Rion, RN, Toma Deiters, RN, BSN;Molly DiVincenzo, MS, ACSM RCEP, Exercise Physiologist    Medication changes reported      No    Fall or balance concerns reported     No    Tobacco Cessation  No Change    Warm-up and Cool-down  Performed as group-led instruction    Resistance Training Performed  Yes    VAD Patient?  No    PAD/SET Patient?  No        Pain Assessment          Currently in Pain?  No/denies           Capillary Blood Glucose: No results found for this or any previous visit (from the past 24 hour(s)).    Social History   Tobacco Use  Smoking Status Never Smoker  Smokeless Tobacco Never Used    Goals Met:  Exercise tolerated well  Goals Unmet:  Not Applicable  Comments: pt arrived at cardiac rehab c/o nosebleed last night that resolved with applying pressure, lasted 2 hours. Pt exercised without any difficulty. Pt held medications today including Brilinta and ASA.   Pt instructed to use saline nasal spray and/or neosporin PRN to moisturize nasal passages.  Pt return demonstrated proper pressure methods if  Nose bleed recurs. Pt instructed to notify PCP of recurring nose bleeds for evaluation/treatment.  Pt instructed when to present to ED or urgent care after hours. Pt also instructed to take all medications as prescribed.   Understanding verbalized. Andi Hence, RN, BSN Cardiac Pulmonary Rehab    Dr. Fransico Him is Medical Director for Cardiac Rehab at [facility].

## 2018-09-23 ENCOUNTER — Encounter (HOSPITAL_COMMUNITY): Payer: Medicare Other

## 2018-09-23 ENCOUNTER — Encounter (HOSPITAL_COMMUNITY)
Admission: RE | Admit: 2018-09-23 | Discharge: 2018-09-23 | Disposition: A | Discharge status: Home / Self Care | Payer: Medicare Other | Source: Ambulatory Visit | Attending: Cardiology | Admitting: Cardiology

## 2018-09-23 DIAGNOSIS — I214 Non-ST elevation (NSTEMI) myocardial infarction: Secondary | ICD-10-CM

## 2018-09-23 DIAGNOSIS — Z955 Presence of coronary angioplasty implant and graft: Secondary | ICD-10-CM

## 2018-09-25 ENCOUNTER — Encounter (HOSPITAL_COMMUNITY): Payer: Medicare Other

## 2018-09-25 ENCOUNTER — Encounter (HOSPITAL_COMMUNITY)
Admission: RE | Admit: 2018-09-25 | Discharge: 2018-09-25 | Disposition: A | Discharge status: Home / Self Care | Payer: Medicare Other | Source: Ambulatory Visit | Attending: Cardiology | Admitting: Cardiology

## 2018-09-25 DIAGNOSIS — I214 Non-ST elevation (NSTEMI) myocardial infarction: Secondary | ICD-10-CM | POA: Diagnosis not present

## 2018-09-25 DIAGNOSIS — Z955 Presence of coronary angioplasty implant and graft: Secondary | ICD-10-CM

## 2018-09-26 NOTE — Progress Notes (Signed)
Cardiac Individual Treatment Plan  Patient Details  Name: Matthew Nunez MRN: [id number] Date of Birth: February 16, 1946 Referring Provider:   Flowsheet Row CARDIAC REHAB PHASE II ORIENTATION from 07/23/2018 in [facility]  Referring Provider  Candee Furbish MD      Initial Encounter Date:  [place] from 07/23/2018 in [place]  Date  07/23/18      Visit Diagnosis: 04/29/2018 NSTEMI (non-ST elevated myocardial infarction) ([place])  04/29/2018 Stented coronary artery  Patient's Home Medications on Admission:  Current Outpatient Medications:  .  aspirin EC 81 MG tablet, Take 81 mg by mouth daily. , Disp: , Rfl:  .  atorvastatin (LIPITOR) 80 MG tablet, Take 1 tablet (80 mg total) by mouth daily at 6 PM., Disp: 90 tablet, Rfl: 1 .  metoprolol tartrate (LOPRESSOR) 25 MG tablet, Take 0.5 tablets (12.5 mg total) by mouth 2 (two) times daily., Disp: 60 tablet, Rfl: 2 .  nitroGLYCERIN (NITROSTAT) 0.4 MG SL tablet, Place 1 tablet (0.4 mg total) under the tongue every 5 (five) minutes x 3 doses as needed for chest pain., Disp: 25 tablet, Rfl: 1 .  ticagrelor (BRILINTA) 90 MG TABS tablet, Take 1 tablet (90 mg total) by mouth 2 (two) times daily., Disp: 180 tablet, Rfl: 2  Past Medical History: Past Medical History:  Diagnosis Date  . Coronary artery disease   . Dysthymia   . Mobitz type 1 second degree AV block    history of   . NSTEMI (non-ST elevated myocardial infarction) ([place])    04/30/18 PCI/DES to the mRCA, normal EF  . Varicose veins of legs     Tobacco Use: Social History   Tobacco Use  Smoking Status Never Smoker  Smokeless Tobacco Never Used    Labs: Recent Review Scientist, physiological    Labs for ITP Cardiac and Pulmonary Rehab Latest Ref Rng & Units 05/01/2018 06/07/2018   Cholestrol 100 - 199 mg/dL 173 100   LDLCALC 0 - 99 mg/dL 105(H) 48   HDL >39 mg/dL 56 43   Trlycerides 0 -  149 mg/dL 62 47      Capillary Blood Glucose: No results found for: GLUCAP   Exercise Target Goals: Exercise Program Goal: Individual exercise prescription set using results from initial 6 min walk test and THRR while considering  patient's activity barriers and safety.   Exercise Prescription Goal: Initial exercise prescription builds to 30-45 minutes a day of aerobic activity, 2-3 days per week.  Home exercise guidelines will be given to patient during program as part of exercise prescription that the participant will acknowledge.  Activity Barriers & Risk Stratification: Activity Barriers & Cardiac Risk Stratification - 07/23/18 0838    Activity Barriers & Cardiac Risk Stratification          Activity Barriers  Deconditioning;Muscular Weakness    Cardiac Risk Stratification  High           6 Minute Walk: 6 Minute Walk    6 Minute Walk    Row Name 07/23/18 1101 07/23/18 1125   Phase  Initial  no documentation   Distance  1747 feet  no documentation   Walk Time  6 minutes  no documentation   # of Rest Breaks  0  no documentation   MPH  3.3  3.3   METS  3.4  3.6   RPE  9  no documentation   VO2 Peak  11.9  12.6   Symptoms  No  no documentation   Resting HR  69 bpm  no documentation   Resting BP  138/64  no documentation   Resting Oxygen Saturation   95 %  no documentation   Exercise Oxygen Saturation  during 6 min walk  98 %  no documentation   Max Ex. HR  87 bpm  no documentation   Max Ex. BP  142/80  no documentation   2 Minute Post BP  130/70  no documentation          Oxygen Initial Assessment:   Oxygen Re-Evaluation:   Oxygen Discharge (Final Oxygen Re-Evaluation):   Initial Exercise Prescription: Initial Exercise Prescription - 07/23/18 1100    Date of Initial Exercise RX and Referring Provider          Date  07/23/18    Referring Provider  Candee Furbish MD        Treadmill          MPH  --    Grade  --    Minutes  --    METs  --         Bike          Level  0.8    Minutes  10    METs  2.51        NuStep          Level  3    SPM  80    Minutes  10    METs  2.5        Track          Laps  12    Minutes  10    METs  3.09        Prescription Details          Frequency (times per week)  3    Duration  Progress to 30 minutes of continuous aerobic without signs/symptoms of physical distress        Intensity          THRR 40-80% of Max Heartrate  60-121    Ratings of Perceived Exertion  11-13    Perceived Dyspnea  0-4        Progression          Progression  Continue to progress workloads to maintain intensity without signs/symptoms of physical distress.        Resistance Training          Training Prescription  Yes    Weight  4lbs    Reps  10-15           Perform Capillary Blood Glucose checks as needed.  Exercise Prescription Changes: Exercise Prescription Changes    Response to Exercise    Row Name 08/05/18 1500 08/23/18 1413 09/11/18 1600 09/25/18 1240   Blood Pressure (Admit)  124/76  120/80  134/72  120/72   Blood Pressure (Exercise)  144/80  110/60  138/70  162/68   Blood Pressure (Exit)  114/82  120/60  130/70  112/64   Heart Rate (Admit)  66 bpm  60 bpm  58 bpm  60 bpm   Heart Rate (Exercise)  90 bpm  105 bpm  109 bpm  118 bpm   Heart Rate (Exit)  60 bpm  60 bpm  63 bpm  68 bpm   Rating of Perceived Exertion (Exercise)  12  14  13  13    Perceived Dyspnea (Exercise)  no documentation  0  0  0   Symptoms  none  None   None  None   Comments  pt was oriented to exercise equipment today  no documentation  Added Rower to Exercise Precription  no documentation   Duration  Continue with 30 min of aerobic exercise without signs/symptoms of physical distress.  Continue with 30 min of aerobic exercise without signs/symptoms of physical distress.  Continue with 30 min of aerobic exercise without signs/symptoms of physical distress.  Continue with 30 min of aerobic exercise without  signs/symptoms of physical distress.   Intensity  THRR unchanged  THRR unchanged  THRR unchanged  THRR unchanged       Progression    Row Name 08/05/18 1500 08/23/18 1413 09/11/18 1600 09/25/18 1240   Progression  Continue to progress workloads to maintain intensity without signs/symptoms of physical distress.  Continue to progress workloads to maintain intensity without signs/symptoms of physical distress.  Continue to progress workloads to maintain intensity without signs/symptoms of physical distress.  Continue to progress workloads to maintain intensity without signs/symptoms of physical distress.   Average METs  2.6  3.6  4.48  4.88       Resistance Training    Row Name 08/05/18 1500 08/23/18 1413 09/11/18 1600 09/25/18 1240   Training Prescription  Yes  Yes  No  No   Weight  4lbs  4lbs  no documentation  no documentation   Reps  10-15  10-15  no documentation  no documentation   Time  10 Minutes  10 Minutes  no documentation  no documentation       [place] Name 08/05/18 1500 08/23/18 1413 09/11/18 1600 09/25/18 1240   Level  0.8  2  2  2    Minutes  10  10  10  10    METs  2.49  4.58  4.84  4.84       NuStep    Row Name 08/05/18 1500 08/23/18 1413 09/11/18 1600 09/25/18 1240   Level  3  5  5  5    SPM  80  85  95  100   Minutes  10  10  10  10    METs  2.6  2.91  3.5  3.7       Rower    Row Name 08/05/18 1500 08/23/18 1413 09/11/18 1600 09/25/18 1240   Level  no documentation  no documentation  5  5   Watts  no documentation  no documentation  49  73   Minutes  no documentation  no documentation  10  10   METs  no documentation  no documentation  4.48  6.1       Track    Row Name 08/05/18 1500 08/23/18 1413 09/11/18 1600 09/25/18 1240   Laps  10  14  no documentation  no documentation   Minutes  10  10  no documentation  no documentation   METs  2.74  3.45  no documentation  no documentation       Home Exercise Plan    [place] Name 08/05/18 1500 08/23/18 1413  09/11/18 1600 09/25/18 1240   Plans to continue exercise at  no documentation  Home (comment) Walking  Home (comment) Walking  Home (comment) Walking   Frequency  no documentation  Add 2 additional days to program exercise sessions.  Add 2 additional days to program exercise sessions.  Add 2 additional days to program exercise sessions.   Initial Home  Exercises Provided  no documentation  08/16/18  08/16/18  08/16/18          Exercise Comments: Exercise Comments    Row Name 08/05/18 1535 08/16/18 1127 09/26/18 1237   Exercise Comments  Pt completed first session of cardiac rehab without signs and symptoms of physical distress.   Reviewed METs and goals. Pt is off to a great start and has a great understanding of Ex Rx.   Reviewed METs and Goals with pt. Pt is responding well to rower machine. Pt is enjoying the new addition to exercise prescription. Will continue to monitor and progress pt as tolerated.       Exercise Goals and Review: Exercise Goals    Exercise Goals    Row Name 07/23/18 0839   Increase Physical Activity  Yes   Intervention  Provide advice, education, support and counseling about physical activity/exercise needs.;Develop an individualized exercise prescription for aerobic and resistive training based on initial evaluation findings, risk stratification, comorbidities and participant's personal goals.   Expected Outcomes  Short Term: Attend rehab on a regular basis to increase amount of physical activity.;Long Term: Exercising regularly at least 3-5 days a week.;Long Term: Add in home exercise to make exercise part of routine and to increase amount of physical activity.   Increase Strength and Stamina  Yes   Intervention  Provide advice, education, support and counseling about physical activity/exercise needs.;Develop an individualized exercise prescription for aerobic and resistive training based on initial evaluation findings, risk stratification, comorbidities and  participant's personal goals.   Expected Outcomes  Short Term: Increase workloads from initial exercise prescription for resistance, speed, and METs.;Short Term: Perform resistance training exercises routinely during rehab and add in resistance training at home;Long Term: Improve cardiorespiratory fitness, muscular endurance and strength as measured by increased METs and functional capacity (6MWT)   Able to understand and use rate of perceived exertion (RPE) scale  Yes   Intervention  Provide education and explanation on how to use RPE scale   Expected Outcomes  Short Term: Able to use RPE daily in rehab to express subjective intensity level;Long Term:  Able to use RPE to guide intensity level when exercising independently   Knowledge and understanding of Target Heart Rate Range (THRR)  Yes   Intervention  Provide education and explanation of THRR including how the numbers were predicted and where they are located for reference   Expected Outcomes  Short Term: Able to state/look up THRR;Long Term: Able to use THRR to govern intensity when exercising independently;Short Term: Able to use daily as guideline for intensity in rehab   Able to check pulse independently  Yes   Intervention  Provide education and demonstration on how to check pulse in carotid and radial arteries.;Review the importance of being able to check your own pulse for safety during independent exercise   Expected Outcomes  Short Term: Able to explain why pulse checking is important during independent exercise;Long Term: Able to check pulse independently and accurately   Understanding of Exercise Prescription  Yes   Intervention  Provide education, explanation, and written materials on patient's individual exercise prescription   Expected Outcomes  Short Term: Able to explain program exercise prescription;Long Term: Able to explain home exercise prescription to exercise independently          Exercise Goals Re-Evaluation  : Exercise Goals Re-Evaluation    Exercise Goal Re-Evaluation    Row Name 08/16/18 1128 09/26/18 1238   Exercise Goals Review  Increase Physical Activity;Able  to understand and use rate of perceived exertion (RPE) scale;Knowledge and understanding of Target Heart Rate Range (THRR);Understanding of Exercise Prescription;Increase Strength and Stamina;Able to check pulse independently  Increase Physical Activity;Understanding of Exercise Prescription   Comments  Pt is progressing well in cardiac rehab. Pt stated no change in strength and stamina due to only being in the program for a full week. Expressed to patient the importance of compliance with activity and making exercise a proiority. Also reviewed home exercise program.  Pt is continuing to progress and increase workloads. Pt is responding well to exercise. Will continue to monitor and progress pt as tolerated.    Expected Outcomes  Pt will be compliant with HEP in which it will assist with improving overall cardiovascular fitness  Pt will continue to exericse 2-3 days a home. Pt will continue to increase stamina and strength.           Discharge Exercise Prescription (Final Exercise Prescription Changes): Exercise Prescription Changes - 09/25/18 1240    Response to Exercise          Blood Pressure (Admit)  120/72    Blood Pressure (Exercise)  162/68    Blood Pressure (Exit)  112/64    Heart Rate (Admit)  60 bpm    Heart Rate (Exercise)  118 bpm    Heart Rate (Exit)  68 bpm    Rating of Perceived Exertion (Exercise)  13    Perceived Dyspnea (Exercise)  0    Symptoms  None    Comments  --    Duration  Continue with 30 min of aerobic exercise without signs/symptoms of physical distress.    Intensity  THRR unchanged        Progression          Progression  Continue to progress workloads to maintain intensity without signs/symptoms of physical distress.    Average METs  4.88        Resistance Training          Training  Prescription  No        Bike          Level  2    Minutes  10    METs  4.84        NuStep          Level  5    SPM  100    Minutes  10    METs  3.7        Rower          Level  5    Watts  73    Minutes  10    METs  6.1        Home Exercise Plan          Plans to continue exercise at  Home (comment)   Walking   Frequency  Add 2 additional days to program exercise sessions.    Initial Home Exercises Provided  08/16/18           Nutrition:  Target Goals: Understanding of nutrition guidelines, daily intake of sodium 1500mg , cholesterol 200mg , calories 30% from fat and 7% or less from saturated fats, daily to have 5 or more servings of fruits and vegetables.  Biometrics: Pre Biometrics - 07/23/18 1127    Pre Biometrics          Height  6' 0.75" (1.848 m)    Weight  101.1 kg    Waist Circumference  42 inches  Hip Circumference  46.5 inches    Waist to Hip Ratio  0.9 %    BMI (Calculated)  29.6    Triceps Skinfold  13 mm    % Body Fat  28.1 %    Grip Strength  55 kg    Flexibility  10.25 in    Single Leg Stand  16.59 seconds            Nutrition Therapy Plan and Nutrition Goals: Nutrition Therapy & Goals - 08/16/18 0934    Nutrition Therapy          Diet  heart healthy        Personal Nutrition Goals          Nutrition Goal  Pt to identify and limit food sources of saturated fat, trans fat, and sodium    Personal Goal #2  Pt to use a heart healthy oil or other heart healthy recipe modifications instead of margarine, lard, butter, or shortening for baking        Intervention Plan          Intervention  Prescribe, educate and counsel regarding individualized specific dietary modifications aiming towards targeted core components such as weight, hypertension, lipid management, diabetes, heart failure and other comorbidities.    Expected Outcomes  Short Term Goal: Understand basic principles of dietary content, such as calories, fat, sodium,  cholesterol and nutrients.           Nutrition Assessments: Nutrition Assessments - 07/23/18 1047    MEDFICTS Scores          Pre Score  27           Nutrition Goals Re-Evaluation:   Nutrition Goals Re-Evaluation:   Nutrition Goals Discharge (Final Nutrition Goals Re-Evaluation):   Psychosocial: Target Goals: Acknowledge presence or absence of significant depression and/or stress, maximize coping skills, provide positive support system. Participant is able to verbalize types and ability to use techniques and skills needed for reducing stress and depression.  Initial Review & Psychosocial Screening: Initial Psych Review & Screening - 07/23/18 1118    Initial Review          Current issues with  None Identified        Family Dynamics          Good Support System?  Yes   wife, daughter, son        Barriers          Psychosocial barriers to participate in program  There are no identifiable barriers or psychosocial needs.        Screening Interventions          Interventions  Encouraged to exercise           Quality of Life Scores: Quality of Life - 07/23/18 1121    Quality of Life          Select  Quality of Life        Quality of Life Scores          Health/Function Pre  27.3 %    Socioeconomic Pre  24.1 %    Psych/Spiritual Pre  26.4 %    Family Pre  26.4 %    GLOBAL Pre  26.3 %          Scores of 19 and below usually indicate a poorer quality of life in these areas.  A difference of  2-3 points is a clinically meaningful difference.  A difference of 2-3 points in the  total score of the Quality of Life Index has been associated with significant improvement in overall quality of life, self-image, physical symptoms, and general health in studies assessing change in quality of life.  PHQ-9: Recent Review Flowsheet Data    Depression screen [facility] 2/9 08/06/2018   Decreased Interest 0   Down, Depressed, Hopeless 0   PHQ - 2 Score 0      Interpretation of Total Score  Total Score Depression Severity:  1-4 = Minimal depression, 5-9 = Mild depression, 10-14 = Moderate depression, 15-19 = Moderately severe depression, 20-27 = Severe depression   Psychosocial Evaluation and Intervention: Psychosocial Evaluation - 08/06/18 1532    Psychosocial Evaluation & Interventions          Comments  no psychosocial needs identified, no interventions necessary.  pt enjoys swimming and walking his dog.     Expected Outcomes  pt will exhibit positive outlook with good coping skills.     Continue Psychosocial Services   No Follow up required           Psychosocial Re-Evaluation: Psychosocial Re-Evaluation    Psychosocial Re-Evaluation    [place] Name 08/28/18 0800 09/17/18 1445   Current issues with  None Identified  None Identified   Comments  no psychosocial needs identified, no interventions necessary   no psychosocial needs identified, no interventions necessary    Expected Outcomes  pt will exhibit positive outlook with good coping skills.   pt will exhibit positive outlook with good coping skills.    Interventions  Encouraged to attend Cardiac Rehabilitation for the exercise  Encouraged to attend Cardiac Rehabilitation for the exercise   Continue Psychosocial Services   No Follow up required  No Follow up required          Psychosocial Discharge (Final Psychosocial Re-Evaluation): Psychosocial Re-Evaluation - 09/17/18 1445    Psychosocial Re-Evaluation          Current issues with  None Identified    Comments  no psychosocial needs identified, no interventions necessary     Expected Outcomes  pt will exhibit positive outlook with good coping skills.     Interventions  Encouraged to attend Cardiac Rehabilitation for the exercise    Continue Psychosocial Services   No Follow up required           Vocational Rehabilitation: Provide vocational rehab assistance to qualifying candidates.   Vocational Rehab Evaluation &  Intervention: Vocational Rehab - 07/23/18 1118    Initial Vocational Rehab Evaluation & Intervention          Assessment shows need for Vocational Rehabilitation  No           Education: Education Goals: Education classes will be provided on a weekly basis, covering required topics. Participant will state understanding/return demonstration of topics presented.  Learning Barriers/Preferences: Learning Barriers/Preferences - 07/23/18 0845    Learning Barriers/Preferences          Learning Barriers  Sight    Learning Preferences  Skilled Demonstration           Education Topics: Count Your Pulse:  -Group instruction provided by verbal instruction, demonstration, patient participation and written materials to support subject.  Instructors address importance of being able to find your pulse and how to count your pulse when at home without a heart monitor.  Patients get hands on experience counting their pulse with staff help and individually. Flowsheet Row CARDIAC REHAB PHASE II EXERCISE from 09/18/2018 in [place]  Date  09/06/18  Instruction Review Code  2- Demonstrated Understanding      Heart Attack, Angina, and Risk Factor Modification:  -Group instruction provided by verbal instruction, video, and written materials to support subject.  Instructors address signs and symptoms of angina and heart attacks.    Also discuss risk factors for heart disease and how to make changes to improve heart health risk factors.   Functional Fitness:  -Group instruction provided by verbal instruction, demonstration, patient participation, and written materials to support subject.  Instructors address safety measures for doing things around the house.  Discuss how to get up and down off the floor, how to pick things up properly, how to safely get out of a chair without assistance, and balance training. Flowsheet Row CARDIAC REHAB PHASE II EXERCISE from 09/18/2018 in  [place]  Date  09/13/18  Educator  EP  Instruction Review Code  2- Demonstrated Understanding      Meditation and Mindfulness:  -Group instruction provided by verbal instruction, patient participation, and written materials to support subject.  Instructor addresses importance of mindfulness and meditation practice to help reduce stress and improve awareness.  Instructor also leads participants through a meditation exercise.  Flowsheet Row CARDIAC REHAB PHASE II EXERCISE from 09/18/2018 in [place]  Date  08/21/18  Educator  Jeanella Craze  Instruction Review Code  2- Demonstrated Understanding      Stretching for Flexibility and Mobility:  -Group instruction provided by verbal instruction, patient participation, and written materials to support subject.  Instructors lead participants through series of stretches that are designed to increase flexibility thus improving mobility.  These stretches are additional exercise for major muscle groups that are typically performed during regular warm up and cool down.   Hands Only CPR:  -Group verbal, video, and participation provides a basic overview of AHA guidelines for community CPR. Role-play of emergencies allow participants the opportunity to practice calling for help and chest compression technique with discussion of AED use.   Hypertension: -Group verbal and written instruction that provides a basic overview of hypertension including the most recent diagnostic guidelines, risk factor reduction with self-care instructions and medication management. Flowsheet Row CARDIAC REHAB PHASE II EXERCISE from 09/18/2018 in [place]  Date  08/16/18  Educator  RN  Instruction Review Code  2- Demonstrated Understanding       Nutrition I class: Heart Healthy Eating:  -Group instruction provided by PowerPoint slides, verbal discussion, and written  materials to support subject matter. The instructor gives an explanation and review of the Therapeutic Lifestyle Changes diet recommendations, which includes a discussion on lipid goals, dietary fat, sodium, fiber, plant stanol/sterol esters, sugar, and the components of a well-balanced, healthy diet.   Nutrition II class: Lifestyle Skills:  -Group instruction provided by PowerPoint slides, verbal discussion, and written materials to support subject matter. The instructor gives an explanation and review of label reading, grocery shopping for heart health, heart healthy recipe modifications, and ways to make healthier choices when eating out.   Diabetes Question & Answer:  -Group instruction provided by PowerPoint slides, verbal discussion, and written materials to support subject matter. The instructor gives an explanation and review of diabetes co-morbidities, pre- and post-prandial blood glucose goals, pre-exercise blood glucose goals, signs, symptoms, and treatment of hypoglycemia and hyperglycemia, and foot care basics.   Diabetes Blitz:  -Group instruction provided by [employer], verbal discussion, and written materials to  support subject matter. The instructor gives an explanation and review of the physiology behind type 1 and type 2 diabetes, diabetes medications and rational behind using different medications, pre- and post-prandial blood glucose recommendations and Hemoglobin A1c goals, diabetes diet, and exercise including blood glucose guidelines for exercising safely.    Portion Distortion:  -Group instruction provided by PowerPoint slides, verbal discussion, written materials, and food models to support subject matter. The instructor gives an explanation of serving size versus portion size, changes in portions sizes over the last 20 years, and what consists of a serving from each food group.   Stress Management:  -Group instruction provided by verbal instruction, video, and  written materials to support subject matter.  Instructors review role of stress in heart disease and how to cope with stress positively.   Flowsheet Row CARDIAC REHAB PHASE II EXERCISE from 09/18/2018 in [place]  Date  09/11/18  Educator  RN  Instruction Review Code  2- Demonstrated Understanding      Exercising on Your Own:  -Group instruction provided by verbal instruction, power point, and written materials to support subject.  Instructors discuss benefits of exercise, components of exercise, frequency and intensity of exercise, and end points for exercise.  Also discuss use of nitroglycerin and activating EMS.  Review options of places to exercise outside of rehab.  Review guidelines for sex with heart disease. Flowsheet Row CARDIAC REHAB PHASE II EXERCISE from 09/18/2018 in [place]  Date  08/14/18  Educator  EP  Instruction Review Code  2- Demonstrated Understanding      Cardiac Drugs I:  -Group instruction provided by verbal instruction and written materials to support subject.  Instructor reviews cardiac drug classes: antiplatelets, anticoagulants, beta blockers, and statins.  Instructor discusses reasons, side effects, and lifestyle considerations for each drug class. Flowsheet Row CARDIAC REHAB PHASE II EXERCISE from 09/18/2018 in [place]  Date  09/04/18  Educator  pharmacist  Instruction Review Code  2- Demonstrated Understanding      Cardiac Drugs II:  -Group instruction provided by verbal instruction and written materials to support subject.  Instructor reviews cardiac drug classes: angiotensin converting enzyme inhibitors (ACE-I), angiotensin II receptor blockers (ARBs), nitrates, and calcium channel blockers.  Instructor discusses reasons, side effects, and lifestyle considerations for each drug class.   Anatomy and Physiology of the Circulatory System:  Group verbal and  written instruction and models provide basic cardiac anatomy and physiology, with the coronary electrical and arterial systems. Review of: AMI, Angina, Valve disease, Heart Failure, Peripheral Artery Disease, Cardiac Arrhythmia, Pacemakers, and the ICD. Flowsheet Row CARDIAC REHAB PHASE II EXERCISE from 09/18/2018 in [place]  Date  09/18/18  Instruction Review Code  2- Demonstrated Understanding      Other Education:  -Group or individual verbal, written, or video instructions that support the educational goals of the cardiac rehab program.   Holiday Eating Survival Tips:  -Group instruction provided by PowerPoint slides, verbal discussion, and written materials to support subject matter. The instructor gives patients tips, tricks, and techniques to help them not only survive but enjoy the holidays despite the onslaught of food that accompanies the holidays.   Knowledge Questionnaire Score: Knowledge Questionnaire Score - 07/23/18 1118    Knowledge Questionnaire Score          Pre Score  24/24           Core Components/Risk  Factors/Patient Goals at Admission:   Core Components/Risk Factors/Patient Goals Review:  Goals and Risk Factor Review    Core Components/Risk Factors/Patient Goals Review    Row Name 07/23/18 1141 08/28/18 0801 09/17/18 1445   Personal Goals Review  Other  Other  Other   Review  pt with no significant CAD RF.  pt demostrates eagerness to participate in CR program.    pt with no significant CAD RF.  pt demostrates eagerness to participate in CR program.  pt pleased to have regained discipline with exercise. pt is walking at home.   pt with no significant CAD RF.  pt demostrates eagerness to participate in CR program.  pt pleased to have regained discipline with exercise. pt is walking at home. pt feels good resuming home care activities such as lawn aeration and repairing deck boards.    Expected Outcomes  pt will participate in  CR exercise, nutrition and lifestyle modification opportunities.   pt will participate in CR exercise, nutrition and lifestyle modification opportunities.   pt will participate in CR exercise, nutrition and lifestyle modification opportunities.           Core Components/Risk Factors/Patient Goals at Discharge (Final Review):  Goals and Risk Factor Review - 09/17/18 1445    Core Components/Risk Factors/Patient Goals Review          Personal Goals Review  Other    Review  pt with no significant CAD RF.  pt demostrates eagerness to participate in CR program.  pt pleased to have regained discipline with exercise. pt is walking at home. pt feels good resuming home care activities such as lawn aeration and repairing deck boards.     Expected Outcomes  pt will participate in CR exercise, nutrition and lifestyle modification opportunities.            ITP Comments: ITP Comments    Row Name 07/22/18 1052 08/06/18 1529 08/28/18 0759 09/17/18 1444   ITP Comments  Dr. Fransico Him, Medical Director   pt started group exercise 08/05/2018.  pt tolerated light activity without difficulty. pt oriented to exercise equipment and safety routine.   30 day ITP review.  pt with good attendance and participation.    30 day ITP review.  pt with good attendance and participation.  pt demonstrates increased eagerness to participate in CR activities.       Comments:

## 2018-09-27 ENCOUNTER — Encounter (HOSPITAL_COMMUNITY): Payer: Medicare Other

## 2018-09-27 ENCOUNTER — Encounter (HOSPITAL_COMMUNITY)
Admission: RE | Admit: 2018-09-27 | Discharge: 2018-09-27 | Disposition: A | Discharge status: Home / Self Care | Payer: Medicare Other | Source: Ambulatory Visit | Attending: Cardiology | Admitting: Cardiology

## 2018-09-27 DIAGNOSIS — I214 Non-ST elevation (NSTEMI) myocardial infarction: Secondary | ICD-10-CM | POA: Diagnosis not present

## 2018-09-27 DIAGNOSIS — Z955 Presence of coronary angioplasty implant and graft: Secondary | ICD-10-CM

## 2018-09-30 ENCOUNTER — Encounter (HOSPITAL_COMMUNITY): Payer: Medicare Other

## 2018-09-30 ENCOUNTER — Encounter (HOSPITAL_COMMUNITY)
Admission: RE | Admit: 2018-09-30 | Discharge: 2018-09-30 | Disposition: A | Discharge status: Home / Self Care | Payer: Medicare Other | Source: Ambulatory Visit | Attending: Cardiology | Admitting: Cardiology

## 2018-09-30 DIAGNOSIS — Z955 Presence of coronary angioplasty implant and graft: Secondary | ICD-10-CM

## 2018-09-30 DIAGNOSIS — I214 Non-ST elevation (NSTEMI) myocardial infarction: Secondary | ICD-10-CM | POA: Diagnosis not present

## 2018-10-02 ENCOUNTER — Encounter (HOSPITAL_COMMUNITY)
Admission: RE | Admit: 2018-10-02 | Discharge: 2018-10-02 | Disposition: A | Discharge status: Home / Self Care | Payer: Medicare Other | Source: Ambulatory Visit | Attending: Cardiology | Admitting: Cardiology

## 2018-10-02 ENCOUNTER — Encounter (HOSPITAL_COMMUNITY): Payer: Medicare Other

## 2018-10-02 DIAGNOSIS — I214 Non-ST elevation (NSTEMI) myocardial infarction: Secondary | ICD-10-CM

## 2018-10-02 DIAGNOSIS — Z955 Presence of coronary angioplasty implant and graft: Secondary | ICD-10-CM | POA: Diagnosis not present

## 2018-10-04 ENCOUNTER — Encounter (HOSPITAL_COMMUNITY)
Admission: RE | Admit: 2018-10-04 | Discharge: 2018-10-04 | Disposition: A | Discharge status: Home / Self Care | Payer: Medicare Other | Source: Ambulatory Visit | Attending: Cardiology | Admitting: Cardiology

## 2018-10-04 ENCOUNTER — Encounter (HOSPITAL_COMMUNITY): Payer: Medicare Other

## 2018-10-04 DIAGNOSIS — Z955 Presence of coronary angioplasty implant and graft: Secondary | ICD-10-CM | POA: Diagnosis not present

## 2018-10-04 DIAGNOSIS — I214 Non-ST elevation (NSTEMI) myocardial infarction: Secondary | ICD-10-CM | POA: Diagnosis not present

## 2018-10-07 ENCOUNTER — Encounter (HOSPITAL_COMMUNITY): Payer: Medicare Other

## 2018-10-07 ENCOUNTER — Encounter (HOSPITAL_COMMUNITY)
Admission: RE | Admit: 2018-10-07 | Discharge: 2018-10-07 | Disposition: A | Discharge status: Home / Self Care | Payer: Medicare Other | Source: Ambulatory Visit | Attending: Cardiology | Admitting: Cardiology

## 2018-10-07 DIAGNOSIS — I214 Non-ST elevation (NSTEMI) myocardial infarction: Secondary | ICD-10-CM

## 2018-10-07 DIAGNOSIS — Z955 Presence of coronary angioplasty implant and graft: Secondary | ICD-10-CM

## 2018-10-08 DIAGNOSIS — Z13220 Encounter for screening for lipoid disorders: Secondary | ICD-10-CM | POA: Diagnosis not present

## 2018-10-08 DIAGNOSIS — Z131 Encounter for screening for diabetes mellitus: Secondary | ICD-10-CM | POA: Diagnosis not present

## 2018-10-08 DIAGNOSIS — Z136 Encounter for screening for cardiovascular disorders: Secondary | ICD-10-CM | POA: Diagnosis not present

## 2018-10-08 DIAGNOSIS — Z125 Encounter for screening for malignant neoplasm of prostate: Secondary | ICD-10-CM | POA: Diagnosis not present

## 2018-10-09 ENCOUNTER — Encounter (HOSPITAL_COMMUNITY): Payer: Medicare Other

## 2018-10-09 DIAGNOSIS — Z23 Encounter for immunization: Secondary | ICD-10-CM | POA: Diagnosis not present

## 2018-10-09 DIAGNOSIS — Z1331 Encounter for screening for depression: Secondary | ICD-10-CM | POA: Diagnosis not present

## 2018-10-09 DIAGNOSIS — Z0000 Encounter for general adult medical examination without abnormal findings: Secondary | ICD-10-CM | POA: Diagnosis not present

## 2018-10-09 DIAGNOSIS — N529 Male erectile dysfunction, unspecified: Secondary | ICD-10-CM | POA: Diagnosis not present

## 2018-10-09 DIAGNOSIS — Z79899 Other long term (current) drug therapy: Secondary | ICD-10-CM | POA: Diagnosis not present

## 2018-10-09 DIAGNOSIS — Z125 Encounter for screening for malignant neoplasm of prostate: Secondary | ICD-10-CM | POA: Diagnosis not present

## 2018-10-09 DIAGNOSIS — L570 Actinic keratosis: Secondary | ICD-10-CM | POA: Diagnosis not present

## 2018-10-09 DIAGNOSIS — I2510 Atherosclerotic heart disease of native coronary artery without angina pectoris: Secondary | ICD-10-CM | POA: Diagnosis not present

## 2018-10-11 ENCOUNTER — Encounter (HOSPITAL_COMMUNITY): Payer: Medicare Other

## 2018-10-11 ENCOUNTER — Encounter (HOSPITAL_COMMUNITY)
Admission: RE | Admit: 2018-10-11 | Discharge: 2018-10-11 | Disposition: A | Discharge status: Home / Self Care | Payer: Medicare Other | Source: Ambulatory Visit | Attending: Cardiology | Admitting: Cardiology

## 2018-10-11 DIAGNOSIS — I214 Non-ST elevation (NSTEMI) myocardial infarction: Secondary | ICD-10-CM

## 2018-10-11 DIAGNOSIS — Z955 Presence of coronary angioplasty implant and graft: Secondary | ICD-10-CM

## 2018-10-14 ENCOUNTER — Encounter (HOSPITAL_COMMUNITY): Payer: Medicare Other

## 2018-10-16 ENCOUNTER — Encounter (HOSPITAL_COMMUNITY): Payer: Medicare Other

## 2018-10-16 ENCOUNTER — Encounter (HOSPITAL_COMMUNITY)
Admission: RE | Admit: 2018-10-16 | Discharge: 2018-10-16 | Disposition: A | Discharge status: Home / Self Care | Payer: Medicare Other | Source: Ambulatory Visit | Attending: Cardiology | Admitting: Cardiology

## 2018-10-16 VITALS — Ht 72.75 in | Wt 217.6 lb

## 2018-10-16 DIAGNOSIS — Z955 Presence of coronary angioplasty implant and graft: Secondary | ICD-10-CM

## 2018-10-16 DIAGNOSIS — I214 Non-ST elevation (NSTEMI) myocardial infarction: Secondary | ICD-10-CM | POA: Diagnosis not present

## 2018-10-18 ENCOUNTER — Encounter (HOSPITAL_COMMUNITY): Payer: Medicare Other

## 2018-10-21 ENCOUNTER — Encounter (HOSPITAL_COMMUNITY): Payer: Medicare Other

## 2018-10-21 ENCOUNTER — Encounter (HOSPITAL_COMMUNITY)
Admission: RE | Admit: 2018-10-21 | Discharge: 2018-10-21 | Disposition: A | Discharge status: Home / Self Care | Payer: Medicare Other | Source: Ambulatory Visit | Attending: Cardiology | Admitting: Cardiology

## 2018-10-21 DIAGNOSIS — Z955 Presence of coronary angioplasty implant and graft: Secondary | ICD-10-CM

## 2018-10-21 DIAGNOSIS — I214 Non-ST elevation (NSTEMI) myocardial infarction: Secondary | ICD-10-CM

## 2018-10-23 ENCOUNTER — Encounter (HOSPITAL_COMMUNITY): Payer: Medicare Other

## 2018-10-23 ENCOUNTER — Encounter (HOSPITAL_COMMUNITY)
Admission: RE | Admit: 2018-10-23 | Discharge: 2018-10-23 | Disposition: A | Discharge status: Home / Self Care | Payer: Medicare Other | Source: Ambulatory Visit | Attending: Cardiology | Admitting: Cardiology

## 2018-10-23 DIAGNOSIS — I214 Non-ST elevation (NSTEMI) myocardial infarction: Secondary | ICD-10-CM | POA: Diagnosis not present

## 2018-10-23 DIAGNOSIS — Z955 Presence of coronary angioplasty implant and graft: Secondary | ICD-10-CM

## 2018-10-24 ENCOUNTER — Encounter (HOSPITAL_COMMUNITY): Payer: Self-pay

## 2018-10-24 NOTE — Progress Notes (Signed)
Cardiac Individual Treatment Plan  Patient Details  Name: Matthew Nunez MRN: [id number] Date of Birth: Dec 03, 1946 Referring Provider:   Flowsheet Row CARDIAC REHAB PHASE II ORIENTATION from 07/23/2018 in [place]  Referring Provider  Candee Furbish MD      Initial Encounter Date:  [place] from 07/23/2018 in [place]  Date  07/23/18      Visit Diagnosis: 04/29/2018 NSTEMI (non-ST elevated myocardial infarction) ([place])  04/29/2018 Stented coronary artery  Patient's Home Medications on Admission:  Current Outpatient Medications:  .  aspirin EC 81 MG tablet, Take 81 mg by mouth daily. , Disp: , Rfl:  .  atorvastatin (LIPITOR) 80 MG tablet, Take 1 tablet (80 mg total) by mouth daily at 6 PM., Disp: 90 tablet, Rfl: 1 .  metoprolol tartrate (LOPRESSOR) 25 MG tablet, Take 0.5 tablets (12.5 mg total) by mouth 2 (two) times daily., Disp: 60 tablet, Rfl: 2 .  nitroGLYCERIN (NITROSTAT) 0.4 MG SL tablet, Place 1 tablet (0.4 mg total) under the tongue every 5 (five) minutes x 3 doses as needed for chest pain., Disp: 25 tablet, Rfl: 1 .  ticagrelor (BRILINTA) 90 MG TABS tablet, Take 1 tablet (90 mg total) by mouth 2 (two) times daily., Disp: 180 tablet, Rfl: 2  Past Medical History: Past Medical History:  Diagnosis Date  . Coronary artery disease   . Dysthymia   . Mobitz type 1 second degree AV block    history of   . NSTEMI (non-ST elevated myocardial infarction) ([place])    04/30/18 PCI/DES to the mRCA, normal EF  . Varicose veins of legs     Tobacco Use: Social History   Tobacco Use  Smoking Status Never Smoker  Smokeless Tobacco Never Used    Labs: Recent Review Scientist, physiological    Labs for ITP Cardiac and Pulmonary Rehab Latest Ref Rng & Units 05/01/2018 06/07/2018   Cholestrol 100 - 199 mg/dL 173 100   LDLCALC 0 - 99 mg/dL 105(H) 48   HDL >39 mg/dL 56 43   Trlycerides 0 -  149 mg/dL 62 47      Capillary Blood Glucose: No results found for: GLUCAP   Exercise Target Goals: Exercise Program Goal: Individual exercise prescription set using results from initial 6 min walk test and THRR while considering  patient's activity barriers and safety.   Exercise Prescription Goal: Initial exercise prescription builds to 30-45 minutes a day of aerobic activity, 2-3 days per week.  Home exercise guidelines will be given to patient during program as part of exercise prescription that the participant will acknowledge.  Activity Barriers & Risk Stratification: Activity Barriers & Cardiac Risk Stratification - 07/23/18 0838    Activity Barriers & Cardiac Risk Stratification          Activity Barriers  Deconditioning;Muscular Weakness    Cardiac Risk Stratification  High           6 Minute Walk: 6 Minute Walk    6 Minute Walk    Row Name 07/23/18 1101 07/23/18 1125 10/16/18 1045   Phase  Initial  no documentation  Discharge   Distance  1747 feet  no documentation  2073 feet   Distance % Change  no documentation  no documentation  18.66 %   Distance Feet Change  no documentation  no documentation  326 ft   Walk Time  6 minutes  no documentation  6 minutes   #  of Rest Breaks  0  no documentation  0   MPH  3.3  3.3  3.93   METS  3.4  3.6  4.37   RPE  9  no documentation  10   VO2 Peak  11.9  12.6  15.31   Symptoms  No  no documentation  No   Resting HR  69 bpm  no documentation  59 bpm   Resting BP  138/64  no documentation  102/72   Resting Oxygen Saturation   95 %  no documentation  no documentation   Exercise Oxygen Saturation  during 6 min walk  98 %  no documentation  no documentation   Max Ex. HR  87 bpm  no documentation  101 bpm   Max Ex. BP  142/80  no documentation  140/74   2 Minute Post BP  130/70  no documentation  118/72          Oxygen Initial Assessment:   Oxygen Re-Evaluation:   Oxygen Discharge (Final Oxygen  Re-Evaluation):   Initial Exercise Prescription: Initial Exercise Prescription - 07/23/18 1100    Date of Initial Exercise RX and Referring Provider          Date  07/23/18    Referring Provider  Candee Furbish MD        Treadmill          MPH  --    Grade  --    Minutes  --    METs  --        Bike          Level  0.8    Minutes  10    METs  2.51        NuStep          Level  3    SPM  80    Minutes  10    METs  2.5        Track          Laps  12    Minutes  10    METs  3.09        Prescription Details          Frequency (times per week)  3    Duration  Progress to 30 minutes of continuous aerobic without signs/symptoms of physical distress        Intensity          THRR 40-80% of Max Heartrate  60-121    Ratings of Perceived Exertion  11-13    Perceived Dyspnea  0-4        Progression          Progression  Continue to progress workloads to maintain intensity without signs/symptoms of physical distress.        Resistance Training          Training Prescription  Yes    Weight  4lbs    Reps  10-15           Perform Capillary Blood Glucose checks as needed.  Exercise Prescription Changes: Exercise Prescription Changes    Response to Exercise    Row Name 08/05/18 1500 08/23/18 1413 09/11/18 1600 09/25/18 1240 09/30/18 0956   Blood Pressure (Admit)  124/76  120/80  134/72  120/72  124/80   Blood Pressure (Exercise)  144/80  110/60  138/70  162/68  180/76   Blood Pressure (Exit)  114/82  120/60  130/70  112/64  118/72  Heart Rate (Admit)  66 bpm  60 bpm  58 bpm  60 bpm  67 bpm   Heart Rate (Exercise)  90 bpm  105 bpm  109 bpm  118 bpm  117 bpm   Heart Rate (Exit)  60 bpm  60 bpm  63 bpm  68 bpm  97 bpm   Rating of Perceived Exertion (Exercise)  12  14  13  13  12    Perceived Dyspnea (Exercise)  no documentation  0  0  0  0   Symptoms  none  None   None  None  None   Comments  pt was oriented to exercise equipment today  no documentation   Added Rower to Exercise Precription  no documentation  no documentation   Duration  Continue with 30 min of aerobic exercise without signs/symptoms of physical distress.  Continue with 30 min of aerobic exercise without signs/symptoms of physical distress.  Continue with 30 min of aerobic exercise without signs/symptoms of physical distress.  Continue with 30 min of aerobic exercise without signs/symptoms of physical distress.  Continue with 30 min of aerobic exercise without signs/symptoms of physical distress.   Intensity  THRR unchanged  THRR unchanged  THRR unchanged  THRR unchanged  THRR unchanged       Progression    Row Name 08/05/18 1500 08/23/18 1413 09/11/18 1600 09/25/18 1240 09/30/18 0956   Progression  Continue to progress workloads to maintain intensity without signs/symptoms of physical distress.  Continue to progress workloads to maintain intensity without signs/symptoms of physical distress.  Continue to progress workloads to maintain intensity without signs/symptoms of physical distress.  Continue to progress workloads to maintain intensity without signs/symptoms of physical distress.  Continue to progress workloads to maintain intensity without signs/symptoms of physical distress.   Average METs  2.6  3.6  4.48  4.88  4.41       Resistance Training    Row Name 08/05/18 1500 08/23/18 1413 09/11/18 1600 09/25/18 1240 09/30/18 0956   Training Prescription  Yes  Yes  No  No  Yes   Weight  4lbs  4lbs  no documentation  no documentation  5lb   Reps  10-15  10-15  no documentation  no documentation  10-15   Time  10 Minutes  10 Minutes  no documentation  no documentation  [place] Name 08/05/18 1500 08/23/18 1413 09/11/18 1600 09/25/18 1240 09/30/18 0956   Level  0.8  2  2  2  2    Minutes  10  10  10  10  10    METs  2.49  4.58  4.84  4.84  4.83       NuStep    Row Name 08/05/18 1500 08/23/18 1413 09/11/18 1600 09/25/18 1240 09/30/18 0956   Level  3  5  5  5   6    SPM  80  85  95  100  100   Minutes  10  10  10  10  10    METs  2.6  2.91  3.5  3.7  4       Rower    Row Name 08/05/18 1500 08/23/18 1413 09/11/18 1600 09/25/18 1240 09/30/18 0956   Level  no documentation  no documentation  5  5  5    Watts  no documentation  no documentation  49  73  76   Minutes  no  documentation  no documentation  10  10  10    METs  no documentation  no documentation  4.48  6.1  6.1       Track    Row Name 08/05/18 1500 08/23/18 1413 09/11/18 1600 09/25/18 1240 09/30/18 0956   Laps  10  14  no documentation  no documentation  no documentation   Minutes  10  10  no documentation  no documentation  no documentation   METs  2.74  3.45  no documentation  no documentation  no documentation       [place] Name 08/05/18 1500 08/23/18 1413 09/11/18 1600 09/25/18 1240 09/30/18 0956   Plans to continue exercise at  no documentation  Home (comment) Walking  Home (comment) Walking  Home (comment) Walking  Home (comment) Walking   Frequency  no documentation  Add 2 additional days to program exercise sessions.  Add 2 additional days to program exercise sessions.  Add 2 additional days to program exercise sessions.  Add 2 additional days to program exercise sessions.   Initial Home Exercises Provided  no documentation  08/16/18  08/16/18  08/16/18  08/16/18       Response to Exercise    Row Name 10/23/18 1449   Blood Pressure (Admit)  124/78   Blood Pressure (Exercise)  134/82   Blood Pressure (Exit)  106/68   Heart Rate (Admit)  63 bpm   Heart Rate (Exercise)  116 bpm   Heart Rate (Exit)  60 bpm   Rating of Perceived Exertion (Exercise)  12   Perceived Dyspnea (Exercise)  0   Symptoms  None   Duration  Continue with 30 min of aerobic exercise without signs/symptoms of physical distress.   Intensity  THRR unchanged       Progression    Row Name 10/23/18 1449   Progression  Continue to progress workloads to maintain intensity without  signs/symptoms of physical distress.   Average METs  5.17       Resistance Training    Row Name 10/23/18 1449   Training Prescription  No   Weight  no documentation   Reps  no documentation   Time  no documentation       [place] Name 10/23/18 1449   Level  2   Minutes  10   METs  4.82       NuStep    Row Name 10/23/18 1449   Level  6   SPM  100   Minutes  10   METs  4.2       Rower    Row Name 10/23/18 1449   Level  5   Watts  76   Minutes  10   METs  6.5       Home Exercise Plan    [place] Name 10/23/18 1449   Plans to continue exercise at  Home (comment) Walking   Frequency  Add 2 additional days to program exercise sessions.   Initial Home Exercises Provided  08/16/18          Exercise Comments: Exercise Comments    Row Name 08/05/18 1535 08/16/18 1127 09/26/18 1237 10/23/18 1454   Exercise Comments  Pt completed first session of cardiac rehab without signs and symptoms of physical distress.   Reviewed METs and goals. Pt is off to a great start and has a great understanding of Ex Rx.   Reviewed METs and Goals  with pt. Pt is responding well to rower machine. Pt is enjoying the new addition to exercise prescription. Will continue to monitor and progress pt as tolerated.   Pt is increasing MET level with each session. MET level has reached 5.17. Continues to exercise at home.       Exercise Goals and Review: Exercise Goals    Exercise Goals    Row Name 07/23/18 0839   Increase Physical Activity  Yes   Intervention  Provide advice, education, support and counseling about physical activity/exercise needs.;Develop an individualized exercise prescription for aerobic and resistive training based on initial evaluation findings, risk stratification, comorbidities and participant's personal goals.   Expected Outcomes  Short Term: Attend rehab on a regular basis to increase amount of physical activity.;Long Term: Exercising regularly at least 3-5 days a week.;Long  Term: Add in home exercise to make exercise part of routine and to increase amount of physical activity.   Increase Strength and Stamina  Yes   Intervention  Provide advice, education, support and counseling about physical activity/exercise needs.;Develop an individualized exercise prescription for aerobic and resistive training based on initial evaluation findings, risk stratification, comorbidities and participant's personal goals.   Expected Outcomes  Short Term: Increase workloads from initial exercise prescription for resistance, speed, and METs.;Short Term: Perform resistance training exercises routinely during rehab and add in resistance training at home;Long Term: Improve cardiorespiratory fitness, muscular endurance and strength as measured by increased METs and functional capacity (6MWT)   Able to understand and use rate of perceived exertion (RPE) scale  Yes   Intervention  Provide education and explanation on how to use RPE scale   Expected Outcomes  Short Term: Able to use RPE daily in rehab to express subjective intensity level;Long Term:  Able to use RPE to guide intensity level when exercising independently   Knowledge and understanding of Target Heart Rate Range (THRR)  Yes   Intervention  Provide education and explanation of THRR including how the numbers were predicted and where they are located for reference   Expected Outcomes  Short Term: Able to state/look up THRR;Long Term: Able to use THRR to govern intensity when exercising independently;Short Term: Able to use daily as guideline for intensity in rehab   Able to check pulse independently  Yes   Intervention  Provide education and demonstration on how to check pulse in carotid and radial arteries.;Review the importance of being able to check your own pulse for safety during independent exercise   Expected Outcomes  Short Term: Able to explain why pulse checking is important during independent exercise;Long Term: Able to check  pulse independently and accurately   Understanding of Exercise Prescription  Yes   Intervention  Provide education, explanation, and written materials on patient's individual exercise prescription   Expected Outcomes  Short Term: Able to explain program exercise prescription;Long Term: Able to explain home exercise prescription to exercise independently          Exercise Goals Re-Evaluation : Exercise Goals Re-Evaluation    Exercise Goal Re-Evaluation    Row Name 08/16/18 1128 09/26/18 1238 10/23/18 1452   Exercise Goals Review  Increase Physical Activity;Able to understand and use rate of perceived exertion (RPE) scale;Knowledge and understanding of Target Heart Rate Range (THRR);Understanding of Exercise Prescription;Increase Strength and Stamina;Able to check pulse independently  Increase Physical Activity;Understanding of Exercise Prescription  Increase Physical Activity;Able to understand and use rate of perceived exertion (RPE) scale;Knowledge and understanding of Target Heart Rate Range (THRR);Understanding of Exercise  Prescription;Increase Strength and Stamina;Able to check pulse independently   Comments  Pt is progressing well in cardiac rehab. Pt stated no change in strength and stamina due to only being in the program for a full week. Expressed to patient the importance of compliance with activity and making exercise a proiority. Also reviewed home exercise program.  Pt is continuing to progress and increase workloads. Pt is responding well to exercise. Will continue to monitor and progress pt as tolerated.   Pt is progressing and tolerating exercise well. Has increased MET level to 5.17. Exercising at home in addition to Cardiac Rehab.    Expected Outcomes  Pt will be compliant with HEP in which it will assist with improving overall cardiovascular fitness  Pt will continue to exericse 2-3 days a home. Pt will continue to increase stamina and strength.   Will continue to monitor and  progress patient as tolerated.           Discharge Exercise Prescription (Final Exercise Prescription Changes): Exercise Prescription Changes - 10/23/18 1449    Response to Exercise          Blood Pressure (Admit)  124/78    Blood Pressure (Exercise)  134/82    Blood Pressure (Exit)  106/68    Heart Rate (Admit)  63 bpm    Heart Rate (Exercise)  116 bpm    Heart Rate (Exit)  60 bpm    Rating of Perceived Exertion (Exercise)  12    Perceived Dyspnea (Exercise)  0    Symptoms  None    Duration  Continue with 30 min of aerobic exercise without signs/symptoms of physical distress.    Intensity  THRR unchanged        Progression          Progression  Continue to progress workloads to maintain intensity without signs/symptoms of physical distress.    Average METs  5.17        Resistance Training          Training Prescription  No    Weight  --    Reps  --    Time  --        Bike          Level  2    Minutes  10    METs  4.82        NuStep          Level  6    SPM  100    Minutes  10    METs  4.2        Rower          Level  5    Watts  76    Minutes  10    METs  6.5        Home Exercise Plan          Plans to continue exercise at  Home (comment)   Walking   Frequency  Add 2 additional days to program exercise sessions.    Initial Home Exercises Provided  08/16/18           Nutrition:  Target Goals: Understanding of nutrition guidelines, daily intake of sodium <1553m, cholesterol <2099m calories 30% from fat and 7% or less from saturated fats, daily to have 5 or more servings of fruits and vegetables.  Biometrics: Pre Biometrics - 07/23/18 1127    Pre Biometrics          Height  6' 0.75" (1.848  m)    Weight  101.1 kg    Waist Circumference  42 inches    Hip Circumference  46.5 inches    Waist to Hip Ratio  0.9 %    BMI (Calculated)  29.6    Triceps Skinfold  13 mm    % Body Fat  28.1 %    Grip Strength  55 kg    Flexibility  10.25 in     Single Leg Stand  16.59 seconds          Post Biometrics - 10/16/18 1046     Post  Biometrics          Height  6' 0.75" (1.848 m)    Weight  98.7 kg    Waist Circumference  42.5 inches    Hip Circumference  44 inches    Waist to Hip Ratio  0.97 %    BMI (Calculated)  28.9    Triceps Skinfold  20 mm    % Body Fat  29.9 %    Grip Strength  56 kg    Flexibility  11 in    Single Leg Stand  30 seconds           Nutrition Therapy Plan and Nutrition Goals: Nutrition Therapy & Goals - 08/16/18 0934    Nutrition Therapy          Diet  heart healthy        Personal Nutrition Goals          Nutrition Goal  Pt to identify and limit food sources of saturated fat, trans fat, and sodium    Personal Goal #2  Pt to use a heart healthy oil or other heart healthy recipe modifications instead of margarine, lard, butter, or shortening for baking        Intervention Plan          Intervention  Prescribe, educate and counsel regarding individualized specific dietary modifications aiming towards targeted core components such as weight, hypertension, lipid management, diabetes, heart failure and other comorbidities.    Expected Outcomes  Short Term Goal: Understand basic principles of dietary content, such as calories, fat, sodium, cholesterol and nutrients.           Nutrition Assessments: Nutrition Assessments - 07/23/18 1047    MEDFICTS Scores          Pre Score  27           Nutrition Goals Re-Evaluation:   Nutrition Goals Re-Evaluation:   Nutrition Goals Discharge (Final Nutrition Goals Re-Evaluation):   Psychosocial: Target Goals: Acknowledge presence or absence of significant depression and/or stress, maximize coping skills, provide positive support system. Participant is able to verbalize types and ability to use techniques and skills needed for reducing stress and depression.  Initial Review & Psychosocial Screening: Initial Psych Review & Screening - 07/23/18  1118    Initial Review          Current issues with  None Identified        Family Dynamics          Good Support System?  Yes   wife, daughter, son        Barriers          Psychosocial barriers to participate in program  There are no identifiable barriers or psychosocial needs.        Screening Interventions          Interventions  Encouraged to exercise  Quality of Life Scores: Quality of Life - 07/23/18 1121    Quality of Life          Select  Quality of Life        Quality of Life Scores          Health/Function Pre  27.3 %    Socioeconomic Pre  24.1 %    Psych/Spiritual Pre  26.4 %    Family Pre  26.4 %    GLOBAL Pre  26.3 %          Scores of 19 and below usually indicate a poorer quality of life in these areas.  A difference of  2-3 points is a clinically meaningful difference.  A difference of 2-3 points in the total score of the Quality of Life Index has been associated with significant improvement in overall quality of life, self-image, physical symptoms, and general health in studies assessing change in quality of life.  PHQ-9: Recent Review Flowsheet Data    Depression screen [facility] 2/9 08/06/2018   Decreased Interest 0   Down, Depressed, Hopeless 0   PHQ - 2 Score 0     Interpretation of Total Score  Total Score Depression Severity:  1-4 = Minimal depression, 5-9 = Mild depression, 10-14 = Moderate depression, 15-19 = Moderately severe depression, 20-27 = Severe depression   Psychosocial Evaluation and Intervention: Psychosocial Evaluation - 08/06/18 1532    Psychosocial Evaluation & Interventions          Comments  no psychosocial needs identified, no interventions necessary.  pt enjoys swimming and walking his dog.     Expected Outcomes  pt will exhibit positive outlook with good coping skills.     Continue Psychosocial Services   No Follow up required           Psychosocial Re-Evaluation: Psychosocial Re-Evaluation     Psychosocial Re-Evaluation    Row Name 08/28/18 0800 09/17/18 1445 10/21/18 4098   Current issues with  None Identified  None Identified  None Identified   Comments  no psychosocial needs identified, no interventions necessary   no psychosocial needs identified, no interventions necessary   no psychosocial needs identified, no interventions necessary    Expected Outcomes  pt will exhibit positive outlook with good coping skills.   pt will exhibit positive outlook with good coping skills.   pt will exhibit positive outlook with good coping skills.    Interventions  Encouraged to attend Cardiac Rehabilitation for the exercise  Encouraged to attend Cardiac Rehabilitation for the exercise  Encouraged to attend Cardiac Rehabilitation for the exercise   Continue Psychosocial Services   No Follow up required  No Follow up required  No Follow up required          Psychosocial Discharge (Final Psychosocial Re-Evaluation): Psychosocial Re-Evaluation - 10/21/18 0718    Psychosocial Re-Evaluation          Current issues with  None Identified    Comments  no psychosocial needs identified, no interventions necessary     Expected Outcomes  pt will exhibit positive outlook with good coping skills.     Interventions  Encouraged to attend Cardiac Rehabilitation for the exercise    Continue Psychosocial Services   No Follow up required           Vocational Rehabilitation: Provide vocational rehab assistance to qualifying candidates.   Vocational Rehab Evaluation & Intervention: Vocational Rehab - 07/23/18 1118    Initial Vocational Rehab Evaluation & Intervention  Assessment shows need for Vocational Rehabilitation  No           Education: Education Goals: Education classes will be provided on a weekly basis, covering required topics. Participant will state understanding/return demonstration of topics presented.  Learning Barriers/Preferences: Learning Barriers/Preferences - 07/23/18  0845    Learning Barriers/Preferences          Learning Barriers  Sight    Learning Preferences  Skilled Demonstration           Education Topics: Count Your Pulse:  -Group instruction provided by verbal instruction, demonstration, patient participation and written materials to support subject.  Instructors address importance of being able to find your pulse and how to count your pulse when at home without a heart monitor.  Patients get hands on experience counting their pulse with staff help and individually. Flowsheet Row CARDIAC REHAB PHASE II EXERCISE from 10/04/2018 in [place]  Date  09/06/18  Instruction Review Code  2- Demonstrated Understanding      Heart Attack, Angina, and Risk Factor Modification:  -Group instruction provided by verbal instruction, video, and written materials to support subject.  Instructors address signs and symptoms of angina and heart attacks.    Also discuss risk factors for heart disease and how to make changes to improve heart health risk factors.   Functional Fitness:  -Group instruction provided by verbal instruction, demonstration, patient participation, and written materials to support subject.  Instructors address safety measures for doing things around the house.  Discuss how to get up and down off the floor, how to pick things up properly, how to safely get out of a chair without assistance, and balance training. Flowsheet Row CARDIAC REHAB PHASE II EXERCISE from 10/04/2018 in [place]  Date  09/13/18  Educator  EP  Instruction Review Code  2- Demonstrated Understanding      Meditation and Mindfulness:  -Group instruction provided by verbal instruction, patient participation, and written materials to support subject.  Instructor addresses importance of mindfulness and meditation practice to help reduce stress and improve awareness.  Instructor also leads participants through  a meditation exercise.  Flowsheet Row CARDIAC REHAB PHASE II EXERCISE from 10/04/2018 in [place]  Date  08/21/18  Educator  Jeanella Craze  Instruction Review Code  2- Demonstrated Understanding      Stretching for Flexibility and Mobility:  -Group instruction provided by verbal instruction, patient participation, and written materials to support subject.  Instructors lead participants through series of stretches that are designed to increase flexibility thus improving mobility.  These stretches are additional exercise for major muscle groups that are typically performed during regular warm up and cool down.   Hands Only CPR:  -Group verbal, video, and participation provides a basic overview of AHA guidelines for community CPR. Role-play of emergencies allow participants the opportunity to practice calling for help and chest compression technique with discussion of AED use.   Hypertension: -Group verbal and written instruction that provides a basic overview of hypertension including the most recent diagnostic guidelines, risk factor reduction with self-care instructions and medication management. Flowsheet Row CARDIAC REHAB PHASE II EXERCISE from 10/04/2018 in [place]  Date  10/04/18  Educator  RN  Instruction Review Code  2- Demonstrated Understanding       Nutrition I class: Heart Healthy Eating:  -Group instruction provided by PowerPoint slides, verbal discussion, and written materials to support  subject matter. The instructor gives an explanation and review of the Therapeutic Lifestyle Changes diet recommendations, which includes a discussion on lipid goals, dietary fat, sodium, fiber, plant stanol/sterol esters, sugar, and the components of a well-balanced, healthy diet.   Nutrition II class: Lifestyle Skills:  -Group instruction provided by PowerPoint slides, verbal discussion, and written materials to support  subject matter. The instructor gives an explanation and review of label reading, grocery shopping for heart health, heart healthy recipe modifications, and ways to make healthier choices when eating out.   Diabetes Question & Answer:  -Group instruction provided by PowerPoint slides, verbal discussion, and written materials to support subject matter. The instructor gives an explanation and review of diabetes co-morbidities, pre- and post-prandial blood glucose goals, pre-exercise blood glucose goals, signs, symptoms, and treatment of hypoglycemia and hyperglycemia, and foot care basics.   Diabetes Blitz:  -Group instruction provided by PowerPoint slides, verbal discussion, and written materials to support subject matter. The instructor gives an explanation and review of the physiology behind type 1 and type 2 diabetes, diabetes medications and rational behind using different medications, pre- and post-prandial blood glucose recommendations and Hemoglobin A1c goals, diabetes diet, and exercise including blood glucose guidelines for exercising safely.    Portion Distortion:  -Group instruction provided by PowerPoint slides, verbal discussion, written materials, and food models to support subject matter. The instructor gives an explanation of serving size versus portion size, changes in portions sizes over the last 20 years, and what consists of a serving from each food group.   Stress Management:  -Group instruction provided by verbal instruction, video, and written materials to support subject matter.  Instructors review role of stress in heart disease and how to cope with stress positively.   Flowsheet Row CARDIAC REHAB PHASE II EXERCISE from 10/04/2018 in [place]  Date  09/11/18  Educator  RN  Instruction Review Code  2- Demonstrated Understanding      Exercising on Your Own:  -Group instruction provided by verbal instruction, power point, and written  materials to support subject.  Instructors discuss benefits of exercise, components of exercise, frequency and intensity of exercise, and end points for exercise.  Also discuss use of nitroglycerin and activating EMS.  Review options of places to exercise outside of rehab.  Review guidelines for sex with heart disease. Flowsheet Row CARDIAC REHAB PHASE II EXERCISE from 10/04/2018 in [place]  Date  08/14/18  Educator  EP  Instruction Review Code  2- Demonstrated Understanding      Cardiac Drugs I:  -Group instruction provided by verbal instruction and written materials to support subject.  Instructor reviews cardiac drug classes: antiplatelets, anticoagulants, beta blockers, and statins.  Instructor discusses reasons, side effects, and lifestyle considerations for each drug class. Flowsheet Row CARDIAC REHAB PHASE II EXERCISE from 10/04/2018 in [place]  Date  09/04/18  Educator  pharmacist  Instruction Review Code  2- Demonstrated Understanding      Cardiac Drugs II:  -Group instruction provided by verbal instruction and written materials to support subject.  Instructor reviews cardiac drug classes: angiotensin converting enzyme inhibitors (ACE-I), angiotensin II receptor blockers (ARBs), nitrates, and calcium channel blockers.  Instructor discusses reasons, side effects, and lifestyle considerations for each drug class. Flowsheet Row CARDIAC REHAB PHASE II EXERCISE from 10/04/2018 in [place]  Date  10/02/18  Educator  Pharmacist  Instruction Review Code  2- Demonstrated  Understanding      Anatomy and Physiology of the Circulatory System:  Group verbal and written instruction and models provide basic cardiac anatomy and physiology, with the coronary electrical and arterial systems. Review of: AMI, Angina, Valve disease, Heart Failure, Peripheral Artery Disease, Cardiac Arrhythmia,  Pacemakers, and the ICD. Flowsheet Row CARDIAC REHAB PHASE II EXERCISE from 10/04/2018 in [place]  Date  09/18/18  Instruction Review Code  2- Demonstrated Understanding      Other Education:  -Group or individual verbal, written, or video instructions that support the educational goals of the cardiac rehab program.   Holiday Eating Survival Tips:  -Group instruction provided by PowerPoint slides, verbal discussion, and written materials to support subject matter. The instructor gives patients tips, tricks, and techniques to help them not only survive but enjoy the holidays despite the onslaught of food that accompanies the holidays.   Knowledge Questionnaire Score: Knowledge Questionnaire Score - 07/23/18 1118    Knowledge Questionnaire Score          Pre Score  24/24           Core Components/Risk Factors/Patient Goals at Admission:   Core Components/Risk Factors/Patient Goals Review:  Goals and Risk Factor Review    Core Components/Risk Factors/Patient Goals Review    Row Name 07/23/18 1141 08/28/18 0801 09/17/18 1445 10/21/18 0718   Personal Goals Review  Other  Other  Other  no documentation   Review  pt with no significant CAD RF.  pt demostrates eagerness to participate in CR program.    pt with no significant CAD RF.  pt demostrates eagerness to participate in CR program.  pt pleased to have regained discipline with exercise. pt is walking at home.   pt with no significant CAD RF.  pt demostrates eagerness to participate in CR program.  pt pleased to have regained discipline with exercise. pt is walking at home. pt feels good resuming home care activities such as lawn aeration and repairing deck boards.   pt with no significant CAD RF.  pt demostrates eagerness to participate in CR program.  pt pleased to have regained discipline with exercise. pt is walking at home. pt is enjoying yard work activities.  Pt has joined [employer].    Expected Outcomes  pt will participate in CR exercise, nutrition and lifestyle modification opportunities.   pt will participate in CR exercise, nutrition and lifestyle modification opportunities.   pt will participate in CR exercise, nutrition and lifestyle modification opportunities.   pt will participate in CR exercise, nutrition and lifestyle modification opportunities.           Core Components/Risk Factors/Patient Goals at Discharge (Final Review):  Goals and Risk Factor Review - 10/21/18 0718    Core Components/Risk Factors/Patient Goals Review          Review  pt with no significant CAD RF.  pt demostrates eagerness to participate in CR program.  pt pleased to have regained discipline with exercise. pt is walking at home. pt is enjoying yard work activities.  Pt has joined [employer].    Expected Outcomes  pt will participate in CR exercise, nutrition and lifestyle modification opportunities.            ITP Comments: ITP Comments    Row Name 07/22/18 1052 08/06/18 1529 08/28/18 0759 09/17/18 1444 10/21/18 0718   ITP Comments  Dr. Fransico Him, Medical Director   pt started group exercise 08/05/2018.  pt tolerated light  activity without difficulty. pt oriented to exercise equipment and safety routine.   30 day ITP review.  pt with good attendance and participation.    30 day ITP review.  pt with good attendance and participation.  pt demonstrates increased eagerness to participate in CR activities.   30 day ITP review.  pt with good attendance and participation.  pt demonstrates increased eagerness to participate in CR activities.       Comments:

## 2018-10-25 ENCOUNTER — Encounter (HOSPITAL_COMMUNITY): Payer: Medicare Other

## 2018-10-25 ENCOUNTER — Encounter (HOSPITAL_COMMUNITY)
Admission: RE | Admit: 2018-10-25 | Discharge: 2018-10-25 | Disposition: A | Discharge status: Home / Self Care | Payer: Medicare Other | Source: Ambulatory Visit | Attending: Cardiology | Admitting: Cardiology

## 2018-10-25 DIAGNOSIS — I214 Non-ST elevation (NSTEMI) myocardial infarction: Secondary | ICD-10-CM | POA: Diagnosis not present

## 2018-10-25 DIAGNOSIS — Z955 Presence of coronary angioplasty implant and graft: Secondary | ICD-10-CM | POA: Diagnosis not present

## 2018-10-28 ENCOUNTER — Encounter (HOSPITAL_COMMUNITY): Payer: Medicare Other

## 2018-10-28 DIAGNOSIS — G4733 Obstructive sleep apnea (adult) (pediatric): Secondary | ICD-10-CM | POA: Diagnosis not present

## 2018-10-30 ENCOUNTER — Encounter (HOSPITAL_COMMUNITY): Payer: Medicare Other

## 2018-11-01 ENCOUNTER — Encounter (HOSPITAL_COMMUNITY)
Admission: RE | Admit: 2018-11-01 | Discharge: 2018-11-01 | Disposition: A | Discharge status: Home / Self Care | Payer: Medicare Other | Source: Ambulatory Visit | Attending: Cardiology | Admitting: Cardiology

## 2018-11-01 ENCOUNTER — Encounter (HOSPITAL_COMMUNITY): Payer: Self-pay

## 2018-11-01 DIAGNOSIS — Z955 Presence of coronary angioplasty implant and graft: Secondary | ICD-10-CM | POA: Diagnosis not present

## 2018-11-01 DIAGNOSIS — I214 Non-ST elevation (NSTEMI) myocardial infarction: Secondary | ICD-10-CM

## 2018-11-14 NOTE — Progress Notes (Signed)
Discharge Progress Report  Patient Details  Name: Matthew Nunez MRN: [id number] Date of Birth: 02/26/1946 Referring Provider:   Flowsheet Row CARDIAC REHAB PHASE II ORIENTATION from 07/23/2018 in [place]  Referring Provider  Candee Furbish MD       Number of Visits: 34   Reason for Discharge:  Patient has met program and personal goals.  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  04/29/2018 NSTEMI (non-ST elevated myocardial infarction) [facility])  ADL UCSD:   Initial Exercise Prescription: Initial Exercise Prescription - 07/23/18 1100    Date of Initial Exercise RX and Referring Provider          Date  07/23/18    Referring Provider  Candee Furbish MD        Treadmill          MPH  --    Grade  --    Minutes  --    METs  --        Bike          Level  0.8    Minutes  10    METs  2.51        NuStep          Level  3    SPM  80    Minutes  10    METs  2.5        Track          Laps  12    Minutes  10    METs  3.09        Prescription Details          Frequency (times per week)  3    Duration  Progress to 30 minutes of continuous aerobic without signs/symptoms of physical distress        Intensity          THRR 40-80% of Max Heartrate  60-121    Ratings of Perceived Exertion  11-13    Perceived Dyspnea  0-4        Progression          Progression  Continue to progress workloads to maintain intensity without signs/symptoms of physical distress.        Resistance Training          Training Prescription  Yes    Weight  4lbs    Reps  10-15           Discharge Exercise Prescription (Final Exercise Prescription Changes): Exercise Prescription Changes - 10/23/18 1449    Response to Exercise          Blood Pressure (Admit)  124/78    Blood Pressure (Exercise)  134/82    Blood  Pressure (Exit)  106/68    Heart Rate (Admit)  63 bpm    Heart Rate (Exercise)  116 bpm    Heart Rate (Exit)  60 bpm    Rating of Perceived Exertion (Exercise)  12    Perceived Dyspnea (Exercise)  0    Symptoms  None    Duration  Continue with 30 min of aerobic exercise without signs/symptoms of physical distress.    Intensity  THRR unchanged        Progression          Progression  Continue to progress workloads to maintain intensity without signs/symptoms of  physical distress.    Average METs  5.17        Resistance Training          Training Prescription  No    Weight  --    Reps  --    Time  --        Bike          Level  2    Minutes  10    METs  4.82        NuStep          Level  6    SPM  100    Minutes  10    METs  4.2        Rower          Level  5    Watts  76    Minutes  10    METs  6.5        Home Exercise Plan          Plans to continue exercise at  Home (comment)   Walking   Frequency  Add 2 additional days to program exercise sessions.    Initial Home Exercises Provided  08/16/18           Functional Capacity: 6 Minute Walk    6 Minute Walk    Row Name 07/23/18 1101 07/23/18 1125 10/16/18 1045   Phase  Initial  no documentation  Discharge   Distance  1747 feet  no documentation  2073 feet   Distance % Change  no documentation  no documentation  18.66 %   Distance Feet Change  no documentation  no documentation  326 ft   Walk Time  6 minutes  no documentation  6 minutes   # of Rest Breaks  0  no documentation  0   MPH  3.3  3.3  3.93   METS  3.4  3.6  4.37   RPE  9  no documentation  10   VO2 Peak  11.9  12.6  15.31   Symptoms  No  no documentation  No   Resting HR  69 bpm  no documentation  59 bpm   Resting BP  138/64  no documentation  102/72   Resting Oxygen Saturation   95 %  no documentation  no documentation   Exercise Oxygen Saturation  during 6 min walk  98 %  no documentation  no documentation   Max Ex. HR  87 bpm   no documentation  101 bpm   Max Ex. BP  142/80  no documentation  140/74   2 Minute Post BP  130/70  no documentation  118/72          Psychological, QOL, Others - Outcomes: PHQ 2/9: Depression screen [facility] 2/9 11/01/2018 08/06/2018  Decreased Interest 0 0  Down, Depressed, Hopeless 1 0  PHQ - 2 Score 1 0    Quality of Life: Quality of Life - 07/23/18 1121    Quality of Life          Select  Quality of Life        Quality of Life Scores          Health/Function Pre  27.3 %    Socioeconomic Pre  24.1 %    Psych/Spiritual Pre  26.4 %    Family Pre  26.4 %    GLOBAL Pre  26.3 %  Personal Goals: Goals established at orientation with interventions provided to work toward goal.    Personal Goals Discharge: Goals and Risk Factor Review    Core Components/Risk Factors/Patient Goals Review    Row Name 07/23/18 1141 08/28/18 0801 09/17/18 1445 10/21/18 0718 11/01/18 0909   Personal Goals Review  Other  Other  Other  no documentation  Other   Review  pt with no significant CAD RF.  pt demostrates eagerness to participate in CR program.    pt with no significant CAD RF.  pt demostrates eagerness to participate in CR program.  pt pleased to have regained discipline with exercise. pt is walking at home.   pt with no significant CAD RF.  pt demostrates eagerness to participate in CR program.  pt pleased to have regained discipline with exercise. pt is walking at home. pt feels good resuming home care activities such as lawn aeration and repairing deck boards.   pt with no significant CAD RF.  pt demostrates eagerness to participate in CR program.  pt pleased to have regained discipline with exercise. pt is walking at home. pt is enjoying yard work activities.  Pt has joined [employer].  pt with no significant CAD RF.  pt demostrates eagerness to participate in CR program.  pt pleased to have regained discipline with exercise. pt is walking at home. pt is enjoying yard work  activities.  Pt has joined [employer]. pt congratulated on meeting his weight loss goals. pt palns to maintain curent weight.    Expected Outcomes  pt will participate in CR exercise, nutrition and lifestyle modification opportunities.   pt will participate in CR exercise, nutrition and lifestyle modification opportunities.   pt will participate in CR exercise, nutrition and lifestyle modification opportunities.   pt will participate in CR exercise, nutrition and lifestyle modification opportunities.   pt will participate in exercise, nutrition and lifestyle modification opportunities in the community.           Exercise Goals and Review: Exercise Goals    Exercise Goals    Row Name 07/23/18 0839   Increase Physical Activity  Yes   Intervention  Provide advice, education, support and counseling about physical activity/exercise needs.;Develop an individualized exercise prescription for aerobic and resistive training based on initial evaluation findings, risk stratification, comorbidities and participant's personal goals.   Expected Outcomes  Short Term: Attend rehab on a regular basis to increase amount of physical activity.;Long Term: Exercising regularly at least 3-5 days a week.;Long Term: Add in home exercise to make exercise part of routine and to increase amount of physical activity.   Increase Strength and Stamina  Yes   Intervention  Provide advice, education, support and counseling about physical activity/exercise needs.;Develop an individualized exercise prescription for aerobic and resistive training based on initial evaluation findings, risk stratification, comorbidities and participant's personal goals.   Expected Outcomes  Short Term: Increase workloads from initial exercise prescription for resistance, speed, and METs.;Short Term: Perform resistance training exercises routinely during rehab and add in resistance training at home;Long Term: Improve cardiorespiratory fitness, muscular  endurance and strength as measured by increased METs and functional capacity (6MWT)   Able to understand and use rate of perceived exertion (RPE) scale  Yes   Intervention  Provide education and explanation on how to use RPE scale   Expected Outcomes  Short Term: Able to use RPE daily in rehab to express subjective intensity level;Long Term:  Able to use RPE to guide intensity level when  exercising independently   Knowledge and understanding of Target Heart Rate Range (THRR)  Yes   Intervention  Provide education and explanation of THRR including how the numbers were predicted and where they are located for reference   Expected Outcomes  Short Term: Able to state/look up THRR;Long Term: Able to use THRR to govern intensity when exercising independently;Short Term: Able to use daily as guideline for intensity in rehab   Able to check pulse independently  Yes   Intervention  Provide education and demonstration on how to check pulse in carotid and radial arteries.;Review the importance of being able to check your own pulse for safety during independent exercise   Expected Outcomes  Short Term: Able to explain why pulse checking is important during independent exercise;Long Term: Able to check pulse independently and accurately   Understanding of Exercise Prescription  Yes   Intervention  Provide education, explanation, and written materials on patient's individual exercise prescription   Expected Outcomes  Short Term: Able to explain program exercise prescription;Long Term: Able to explain home exercise prescription to exercise independently          Exercise Goals Re-Evaluation: Exercise Goals Re-Evaluation    Exercise Goal Re-Evaluation    Row Name 08/16/18 1128 09/26/18 1238 10/23/18 1452 11/01/18 1135   Exercise Goals Review  Increase Physical Activity;Able to understand and use rate of perceived exertion (RPE) scale;Knowledge and understanding of Target Heart Rate Range (THRR);Understanding  of Exercise Prescription;Increase Strength and Stamina;Able to check pulse independently  Increase Physical Activity;Understanding of Exercise Prescription  Increase Physical Activity;Able to understand and use rate of perceived exertion (RPE) scale;Knowledge and understanding of Target Heart Rate Range (THRR);Understanding of Exercise Prescription;Increase Strength and Stamina;Able to check pulse independently  Increase Physical Activity;Able to understand and use rate of perceived exertion (RPE) scale;Knowledge and understanding of Target Heart Rate Range (THRR);Understanding of Exercise Prescription;Increase Strength and Stamina;Able to check pulse independently   Comments  Pt is progressing well in cardiac rehab. Pt stated no change in strength and stamina due to only being in the program for a full week. Expressed to patient the importance of compliance with activity and making exercise a proiority. Also reviewed home exercise program.  Pt is continuing to progress and increase workloads. Pt is responding well to exercise. Will continue to monitor and progress pt as tolerated.   Pt is progressing and tolerating exercise well. Has increased MET level to 5.17. Exercising at home in addition to Cardiac Rehab.   Pt last day of exercise. Pt tolerated exercise well. Reached a 5.17 MET level and plans to continue exercising at the [facility].    Expected Outcomes  Pt will be compliant with HEP in which it will assist with improving overall cardiovascular fitness  Pt will continue to exericse 2-3 days a home. Pt will continue to increase stamina and strength.   Will continue to monitor and progress patient as tolerated.   Pt will continue exercise prescription at the [facility].           Nutrition & Weight - Outcomes: Pre Biometrics - 07/23/18 1127    Pre Biometrics          Height  6' 0.75" (1.848 m)    Weight  101.1 kg    Waist Circumference  42 inches    Hip Circumference  46.5 inches    Waist to Hip Ratio  0.9  %    BMI (Calculated)  29.6    Triceps Skinfold  13 mm    %  Body Fat  28.1 %    Grip Strength  55 kg    Flexibility  10.25 in    Single Leg Stand  16.59 seconds          Post Biometrics - 10/16/18 1046     Post  Biometrics          Height  6' 0.75" (1.848 m)    Weight  98.7 kg    Waist Circumference  42.5 inches    Hip Circumference  44 inches    Waist to Hip Ratio  0.97 %    BMI (Calculated)  28.9    Triceps Skinfold  20 mm    % Body Fat  29.9 %    Grip Strength  56 kg    Flexibility  11 in    Single Leg Stand  30 seconds           Nutrition: Nutrition Therapy & Goals - 08/16/18 0934    Nutrition Therapy          Diet  heart healthy        Personal Nutrition Goals          Nutrition Goal  Pt to identify and limit food sources of saturated fat, trans fat, and sodium    Personal Goal #2  Pt to use a heart healthy oil or other heart healthy recipe modifications instead of margarine, lard, butter, or shortening for baking        Intervention Plan          Intervention  Prescribe, educate and counsel regarding individualized specific dietary modifications aiming towards targeted core components such as weight, hypertension, lipid management, diabetes, heart failure and other comorbidities.    Expected Outcomes  Short Term Goal: Understand basic principles of dietary content, such as calories, fat, sodium, cholesterol and nutrients.           Nutrition Discharge: Nutrition Assessments - 11/08/18 1208    MEDFICTS Scores          Pre Score  27    Post Score  --   pt did not return survey          Education Questionnaire Score: Knowledge Questionnaire Score - 07/23/18 1118    Knowledge Questionnaire Score          Pre Score  24/24           Goals reviewed with patient; copy given to patient.

## 2018-12-05 ENCOUNTER — Other Ambulatory Visit: Payer: Self-pay

## 2018-12-05 MED ORDER — TICAGRELOR 90 MG PO TABS: 90.0000 mg | ORAL_TABLET | Freq: Two times a day (BID) | ORAL | 2 refills | Status: DC

## 2019-01-16 ENCOUNTER — Encounter: Payer: Self-pay | Admitting: Cardiology

## 2019-01-22 ENCOUNTER — Telehealth: Payer: Self-pay | Admitting: *Deleted

## 2019-01-22 NOTE — Telephone Encounter (Signed)
   [facility] Pre-operative Risk Assessment    Request for surgical clearance:  1. What type of surgery is being performed? COLONOSCOPY   2. When is this surgery scheduled? TBD   3. What type of clearance is required (medical clearance vs. Pharmacy clearance to hold med vs. Both)? MEDICAL  4. Are there any medications that need to be held prior to surgery and how long?HOLD BRILINTA X 5 DAYS PRIOR   5. Practice name and name of physician performing surgery? [facility]; DR. Paulita Fujita   6. What is your office phone number [phone]    7.   What is your office fax number [phone]  8.   Anesthesia type (None, local, MAC, general) ? PROPOFOL ?    Matthew Nunez 01/22/2019, 11:31 AM  _________________________________________________________________   (provider comments below)

## 2019-01-28 NOTE — Telephone Encounter (Signed)
Her stent was placed in May 2019.  Currently approximately 9 months of dual antiplatelet therapy.  Given her current stent, synergy DES, it would be reasonable to hold her Brilinta for 5 days prior to colonoscopy at this time.  Risk of stent thrombosis is exceedingly small at this time. Matthew Furbish, MD

## 2019-01-29 NOTE — Telephone Encounter (Signed)
   Primary Cardiologist:Mark Marlou Porch, MD  Chart reviewed as part of pre-operative protocol coverage. Because of Nico Syme Feher's past medical history and time since last visit, he/she will require a follow-up visit in order to better assess preoperative cardiovascular risk.  Pt is to be seen on 02/04/19   Pre-op covering staff: - Please  call patient to inform them Dr. Marlou Porch will clear with appt.. - Please contact requesting surgeon's office via preferred method (i.e, phone, fax) to inform them of need for appointment prior to surgery.  If applicable, this message will also be routed to pharmacy pool and/or primary cardiologist for input on holding anticoagulant/antiplatelet agent as requested below so that this information is available at time of patient's appointment.   Cecilie Kicks, NP  01/29/2019, 3:19 PM

## 2019-01-29 NOTE — Telephone Encounter (Signed)
Call placed to pt re: surgical clearance. Pt has been made aware that his clearance will be addressed at his coming up appt with Dr. Marlou Porch on 02/04/2019. Pt thanked me for the call.

## 2019-02-04 ENCOUNTER — Ambulatory Visit (INDEPENDENT_AMBULATORY_CARE_PROVIDER_SITE_OTHER): Payer: Medicare Other | Admitting: Cardiology

## 2019-02-04 ENCOUNTER — Encounter: Payer: Self-pay | Admitting: Cardiology

## 2019-02-04 VITALS — BP 120/80 | HR 64 | Ht 72.75 in | Wt 221.4 lb

## 2019-02-04 DIAGNOSIS — I4510 Unspecified right bundle-branch block: Secondary | ICD-10-CM

## 2019-02-04 DIAGNOSIS — I252 Old myocardial infarction: Secondary | ICD-10-CM | POA: Diagnosis not present

## 2019-02-04 DIAGNOSIS — I2510 Atherosclerotic heart disease of native coronary artery without angina pectoris: Secondary | ICD-10-CM

## 2019-02-04 DIAGNOSIS — E782 Mixed hyperlipidemia: Secondary | ICD-10-CM | POA: Diagnosis not present

## 2019-02-04 NOTE — Patient Instructions (Addendum)
Medication Instructions:  Please continue your current medications as listed. You may discontinue your Brilinta after 04/29/2018.  Please continue your Aspirin.   If you need a refill on your cardiac medications before your next appointment, please call your pharmacy.   Follow-Up: At [facility], you and your health needs are our priority.  As part of our continuing mission to provide you with exceptional heart care, we have created designated Provider Care Teams.  These Care Teams include your primary Cardiologist (physician) and Advanced Practice Providers (APPs -  Physician Assistants and Nurse Practitioners) who all work together to provide you with the care you need, when you need it. You will need a follow up appointment in 12 months.  Please call our office 2 months in advance to schedule this appointment.  You may see Candee Furbish, MD or one of the following Advanced Practice Providers on your designated Care Team:   Truitt Merle, NP Cecilie Kicks, NP . Kathyrn Drown, NP  Thank you for choosing [facility]!!

## 2019-02-04 NOTE — Progress Notes (Signed)
Cardiology Office Note:    Date:  02/04/2019   ID:  Matthew Nunez, DOB 04/13/1946, MRN [id number]  PCP:  Lawerance Cruel, MD  Cardiologist:  Candee Furbish, MD  Electrophysiologist:  None   Referring MD: Lawerance Cruel, MD     History of Present Illness:    Matthew Nunez is a 73 y.o. male here for preoperative or stratification and follow-up of non-ST elevation myocardial infarction, coronary artery disease.  Previously had a second-degree heart block with a regular rhythm.  Prior vein stripping, Dr. Donnetta Hutching has evaluated his left leg swelling, varicose veins..  Had RCA stent DES placed on 04/29/2018 in the setting of non-ST elevation myocardial infarction.  Mild troponin increase.  Story of his angina was intermittent GERD-like the weekend of the [place].  Walking dog felt short of breath and tired finally on Monday went to [facility] and troponin drawn it was abnormal.  Went to the ER.  EF was reassuring.  Cardiac rehab.  Here due for colonoscopy.  Did have some leg cramp in the middle night. Twice in last year. Dr. Paulita Fujita GI.   Daughter is Software engineer.  Agreed with Brilinta  Retired Customer service manager, works at the [employer].  Denies any fevers chills nausea vomiting syncope bleeding.  Past Medical History:  Diagnosis Date  . Coronary artery disease   . Dysthymia   . Mobitz type 1 second degree AV block    history of   . NSTEMI (non-ST elevated myocardial infarction) ([place])    04/30/18 PCI/DES to the mRCA, normal EF  . Varicose veins of legs     Past Surgical History:  Procedure Laterality Date  . CORONARY STENT INTERVENTION N/A 04/30/2018   Procedure: CORONARY STENT INTERVENTION;  Surgeon: Leonie Man, MD;  Location: [place] [facility];  Service: Cardiovascular;  Laterality: N/A;  . HERNIA REPAIR    . LEFT HEART CATH AND CORONARY ANGIOGRAPHY N/A 04/30/2018   Procedure: LEFT HEART CATH AND CORONARY ANGIOGRAPHY;  Surgeon: Leonie Man, MD;  Location: [place] [facility];  Service: Cardiovascular;  Laterality: N/A;  . VERICOSE VEIN SURGERY  2000    Current Medications: Current Meds  Medication Sig  . aspirin EC 81 MG tablet Take 81 mg by mouth daily.   Marland Kitchen atorvastatin (LIPITOR) 80 MG tablet Take 1 tablet (80 mg total) by mouth daily at 6 PM.  . metoprolol tartrate (LOPRESSOR) 25 MG tablet Take 0.5 tablets (12.5 mg total) by mouth 2 (two) times daily.  . nitroGLYCERIN (NITROSTAT) 0.4 MG SL tablet Place 1 tablet (0.4 mg total) under the tongue every 5 (five) minutes x 3 doses as needed for chest pain.  . ticagrelor (BRILINTA) 90 MG TABS tablet Take 1 tablet (90 mg total) by mouth 2 (two) times daily.     Allergies:   Patient has no known allergies.   Social History   Socioeconomic History  . Marital status: Married    Spouse name: Not on file  . Number of children: Not on file  . Years of education: Not on file  . Highest education level: Not on file  Occupational History  . Not on file  Social Needs  . Financial resource strain: Not on file  . Food insecurity:    Worry: Not on file    Inability: Not on file  . Transportation needs:    Medical: Not on file    Non-medical: Not on file  Tobacco Use  . Smoking status:  Never Smoker  . Smokeless tobacco: Never Used  Substance and Sexual Activity  . Alcohol use: Yes    Comment: 2 DRINKS A WEEK  . Drug use: No  . Sexual activity: Yes    Comment: MARRIED  Lifestyle  . Physical activity:    Days per week: Not on file    Minutes per session: Not on file  . Stress: Not on file  Relationships  . Social connections:    Talks on phone: Not on file    Gets together: Not on file    Attends religious service: Not on file    Active member of club or organization: Not on file    Attends meetings of clubs or organizations: Not on file    Relationship status: Not on file  Other Topics Concern  . Not on file  Social History Narrative  . Not on file     Family History: The  patient's family history includes Alzheimer's disease in his father; Cirrhosis in his brother; Healthy in his sister.  ROS:   Please see the history of present illness.     All other systems reviewed and are negative.  EKGs/Labs/Other Studies Reviewed:    The following studies were reviewed today: Prior office notes lab work catheterization report  EKG:  EKG is  ordered today.  The ekg ordered today demonstrates 02/04/2019-sinus rhythm 64 right bundle branch block possible inferior infarct pattern personally viewed and interpreted 04/2018-subtle ST elevation noted in inferior leads in the setting of RCA infarct 07/25/16-prior EKG shows sinus rhythm, right bundle branch block, prolongation of PR interval with subsequent drop consistent with second-degree heart block type I, Wenkebach.   Recent Labs: 05/01/2018: Hemoglobin 15.8; Platelets 186 06/07/2018: ALT 28; BUN 14; Creatinine, Ser 0.82; Magnesium 1.9; Potassium 4.5; Sodium 138  Recent Lipid Panel    Component Value Date/Time   CHOL 100 06/07/2018 0913   TRIG 47 06/07/2018 0913   HDL 43 06/07/2018 0913   CHOLHDL 2.3 06/07/2018 0913   CHOLHDL 3.1 05/01/2018 0226   VLDL 12 05/01/2018 0226   LDLCALC 48 06/07/2018 0913    Physical Exam:    VS:  BP 120/80   Pulse 64   Ht 6' 0.75" (1.848 m)   Wt 221 lb 6.4 oz (100.4 kg)   SpO2 96%   BMI 29.41 kg/m     Wt Readings from Last 3 Encounters:  02/04/19 221 lb 6.4 oz (100.4 kg)  10/16/18 217 lb 9.5 oz (98.7 kg)  08/16/18 220 lb 10.9 oz (100.1 kg)     GEN:  Well nourished, well developed in no acute distress HEENT: Normal NECK: No JVD; No carotid bruits LYMPHATICS: No lymphadenopathy CARDIAC: RRR, no murmurs, rubs, gallops RESPIRATORY:  Clear to auscultation without rales, wheezing or rhonchi  ABDOMEN: Soft, non-tender, non-distended MUSCULOSKELETAL:  No edema; No deformity  SKIN: Warm and dry NEUROLOGIC:  Alert and oriented x 3 PSYCHIATRIC:  Normal affect   ASSESSMENT:    1.  Coronary artery disease involving native coronary artery of native heart without angina pectoris   2. Mixed hyperlipidemia   3. Right bundle branch block   4. Old MI (myocardial infarction)    PLAN:    In order of problems listed above:  Preoperative or stratification prior to colonoscopy -This is a low risk procedure and certainly from a cardiac perspective he may proceed.  His stent was placed Apr 29, 2018, he is approximately 9 months into his dual antiplatelet therapy.  [employer]  of cardiology guidelines do recommend 1 year prior to elective procedures however in [place] 6 months is reasonable.  We decided to go 1 year post stent placement.  After his anniversary, he can stop his Brilinta and take his aspirin monotherapy.  We discussed the possible evolution of this changing to potentially clopidogrel monotherapy.  Overall he has been doing very well.  Coronary artery disease status post MI -RCA stent.  Doing very well.  No symptoms no angina.  Stopping Brilinta 1 year post stent.  Aspirin only at that point.  Continuing with metoprolol.  Hyperlipidemia -Continue with high intensity statin.  Excellent.  LDL 53  Right bundle branch block - Echocardiogram reassuring.  Excellent.  Varicose veins - Prior consult with Dr. Donnetta Hutching.  Conservative management.   Medication Adjustments/Labs and Tests Ordered: Current medicines are reviewed at length with the patient today.  Concerns regarding medicines are outlined above.  Orders Placed This Encounter  Procedures  . EKG 12-Lead   No orders of the defined types were placed in this encounter.   Patient Instructions  Medication Instructions:  Please continue your current medications as listed. You may discontinue your Brilinta after 04/29/2018.  Please continue your Aspirin.   If you need a refill on your cardiac medications before your next appointment, please call your pharmacy.   Follow-Up: At [facility], you and your health  needs are our priority.  As part of our continuing mission to provide you with exceptional heart care, we have created designated Provider Care Teams.  These Care Teams include your primary Cardiologist (physician) and Advanced Practice Providers (APPs -  Physician Assistants and Nurse Practitioners) who all work together to provide you with the care you need, when you need it. You will need a follow up appointment in 12 months.  Please call our office 2 months in advance to schedule this appointment.  You may see Candee Furbish, MD or one of the following Advanced Practice Providers on your designated Care Team:   Truitt Merle, NP Cecilie Kicks, NP . Kathyrn Drown, NP  Thank you for choosing [facility]!!         Signed, Candee Furbish, MD  02/04/2019 10:11 AM    [place]

## 2019-03-03 ENCOUNTER — Other Ambulatory Visit: Payer: Self-pay | Admitting: Cardiology

## 2019-03-07 ENCOUNTER — Telehealth: Payer: Self-pay

## 2019-03-07 NOTE — Telephone Encounter (Signed)
We received a Brilinta tier exception form from [place]. I have completed the form and faxed it back to [facility].

## 2019-03-10 NOTE — Telephone Encounter (Addendum)
Letter received from [facility] stating that they have denied the pts tier exception request for Brilinta under Medicare Part D. Reason: Brilinta does not qualify for a lower copay (tier exception).   I called the pt and made him aware of this denial and offered him the phone number to contact [place] and [place] to see if they can offer him some assistance with the cost of Brilinta or to send a message to Dr Marlou Porch to see if he can take a less expensive medication. He wants to call [place] and [place] and states that he will call back if they cannot help him.

## 2019-03-11 NOTE — Telephone Encounter (Signed)
Pt has 90 day supply of Brilinta on hand currently. He is doing well with it, but because of the denial of price reduction, he wants to know if there is another medication he can be put on,, maybe Plavix.

## 2019-03-11 NOTE — Telephone Encounter (Signed)
Pt misinformed me, as of today he is out of medication. Are there any samples on hand, or could a new rx be written?

## 2019-03-14 NOTE — Telephone Encounter (Signed)
° °  Pt c/o medication issue:  1. Name of Medication: ticagrelor (BRILINTA) 90 MG TABS tablet  2. How are you currently taking this medication (dosage and times per day)? As written  3. Are you having a reaction (difficulty breathing--STAT)? no  4. What is your medication issue? Too costly

## 2019-03-18 ENCOUNTER — Other Ambulatory Visit: Payer: Self-pay

## 2019-03-18 MED ORDER — CLOPIDOGREL BISULFATE 75 MG PO TABS: 75.0000 mg | ORAL_TABLET | Freq: Every day | ORAL | 3 refills | Status: DC

## 2019-03-18 NOTE — Telephone Encounter (Signed)
The pt states that he did contact [place] and [place] but is not eligible for assistance with them. He states he cannot afford Brilinta at its current cost.  Per Dr Marlou Porch the pt is advised to stop taking Brilinta and to start taking Plavix 75 mg daily. The pt verbalized understanding and thanked me for my help.  I have removed Brilinta from the pts med list and added Plavix 75 mg. Plavix 75 mg take 1 table daily has [facility] sent to [facility] to fill (#[facility] with 3 refills) per the pts request.

## 2019-03-28 ENCOUNTER — Other Ambulatory Visit: Payer: Self-pay | Admitting: Cardiology

## 2019-08-25 DIAGNOSIS — R42 Dizziness and giddiness: Secondary | ICD-10-CM | POA: Diagnosis not present

## 2019-08-25 DIAGNOSIS — Z6829 Body mass index (BMI) 29.0-29.9, adult: Secondary | ICD-10-CM | POA: Diagnosis not present

## 2019-09-22 DIAGNOSIS — Z23 Encounter for immunization: Secondary | ICD-10-CM | POA: Diagnosis not present

## 2019-10-13 DIAGNOSIS — H6123 Impacted cerumen, bilateral: Secondary | ICD-10-CM | POA: Diagnosis not present

## 2019-10-13 DIAGNOSIS — R946 Abnormal results of thyroid function studies: Secondary | ICD-10-CM | POA: Diagnosis not present

## 2019-10-13 DIAGNOSIS — Z79899 Other long term (current) drug therapy: Secondary | ICD-10-CM | POA: Diagnosis not present

## 2019-10-13 DIAGNOSIS — L570 Actinic keratosis: Secondary | ICD-10-CM | POA: Diagnosis not present

## 2019-10-13 DIAGNOSIS — N529 Male erectile dysfunction, unspecified: Secondary | ICD-10-CM | POA: Diagnosis not present

## 2019-10-13 DIAGNOSIS — I2510 Atherosclerotic heart disease of native coronary artery without angina pectoris: Secondary | ICD-10-CM | POA: Diagnosis not present

## 2019-10-13 DIAGNOSIS — Z0000 Encounter for general adult medical examination without abnormal findings: Secondary | ICD-10-CM | POA: Diagnosis not present

## 2019-10-14 ENCOUNTER — Telehealth: Payer: Self-pay | Admitting: Cardiology

## 2019-10-14 NOTE — Telephone Encounter (Signed)
Last lab here for pt was 05/01/2018 H/H 15.8/43.9  plt 186. Will forward to Dr Marlou Porch for review.

## 2019-10-14 NOTE — Telephone Encounter (Signed)
° ° °  Patient would like to know if he can donate blood now that he is off Plavix.

## 2019-10-15 NOTE — Telephone Encounter (Signed)
Left message for pt on private voicemail of Dr Marlou Porch comments - may donate blood.  Requested he c/b if further questions or concerns.

## 2019-10-15 NOTE — Telephone Encounter (Signed)
Yes he may donate blood. Candee Furbish, MD

## 2019-10-16 ENCOUNTER — Other Ambulatory Visit: Payer: Self-pay | Admitting: Family Medicine

## 2019-10-16 DIAGNOSIS — R946 Abnormal results of thyroid function studies: Secondary | ICD-10-CM

## 2019-10-17 DIAGNOSIS — L570 Actinic keratosis: Secondary | ICD-10-CM | POA: Diagnosis not present

## 2019-10-17 DIAGNOSIS — R946 Abnormal results of thyroid function studies: Secondary | ICD-10-CM | POA: Diagnosis not present

## 2019-10-17 DIAGNOSIS — Z79899 Other long term (current) drug therapy: Secondary | ICD-10-CM | POA: Diagnosis not present

## 2019-10-17 DIAGNOSIS — Z0000 Encounter for general adult medical examination without abnormal findings: Secondary | ICD-10-CM | POA: Diagnosis not present

## 2019-10-17 DIAGNOSIS — N529 Male erectile dysfunction, unspecified: Secondary | ICD-10-CM | POA: Diagnosis not present

## 2019-10-17 DIAGNOSIS — I2510 Atherosclerotic heart disease of native coronary artery without angina pectoris: Secondary | ICD-10-CM | POA: Diagnosis not present

## 2019-10-21 ENCOUNTER — Ambulatory Visit
Admission: RE | Admit: 2019-10-21 | Discharge: 2019-10-21 | Disposition: A | Discharge status: Home / Self Care | Payer: Medicare Other | Source: Ambulatory Visit | Attending: Family Medicine | Admitting: Family Medicine

## 2019-10-21 DIAGNOSIS — R946 Abnormal results of thyroid function studies: Secondary | ICD-10-CM

## 2019-10-21 DIAGNOSIS — E041 Nontoxic single thyroid nodule: Secondary | ICD-10-CM | POA: Diagnosis not present

## 2019-10-29 DIAGNOSIS — G4733 Obstructive sleep apnea (adult) (pediatric): Secondary | ICD-10-CM | POA: Diagnosis not present

## 2019-11-05 IMAGING — DX DG CHEST 2V
2 series · 2 of 2 positions shown · non-contrast
Comparison: None.

CLINICAL DATA: 71-year-old male with chest pain.

EXAM:
CHEST - 2 VIEW

[w chest pa]
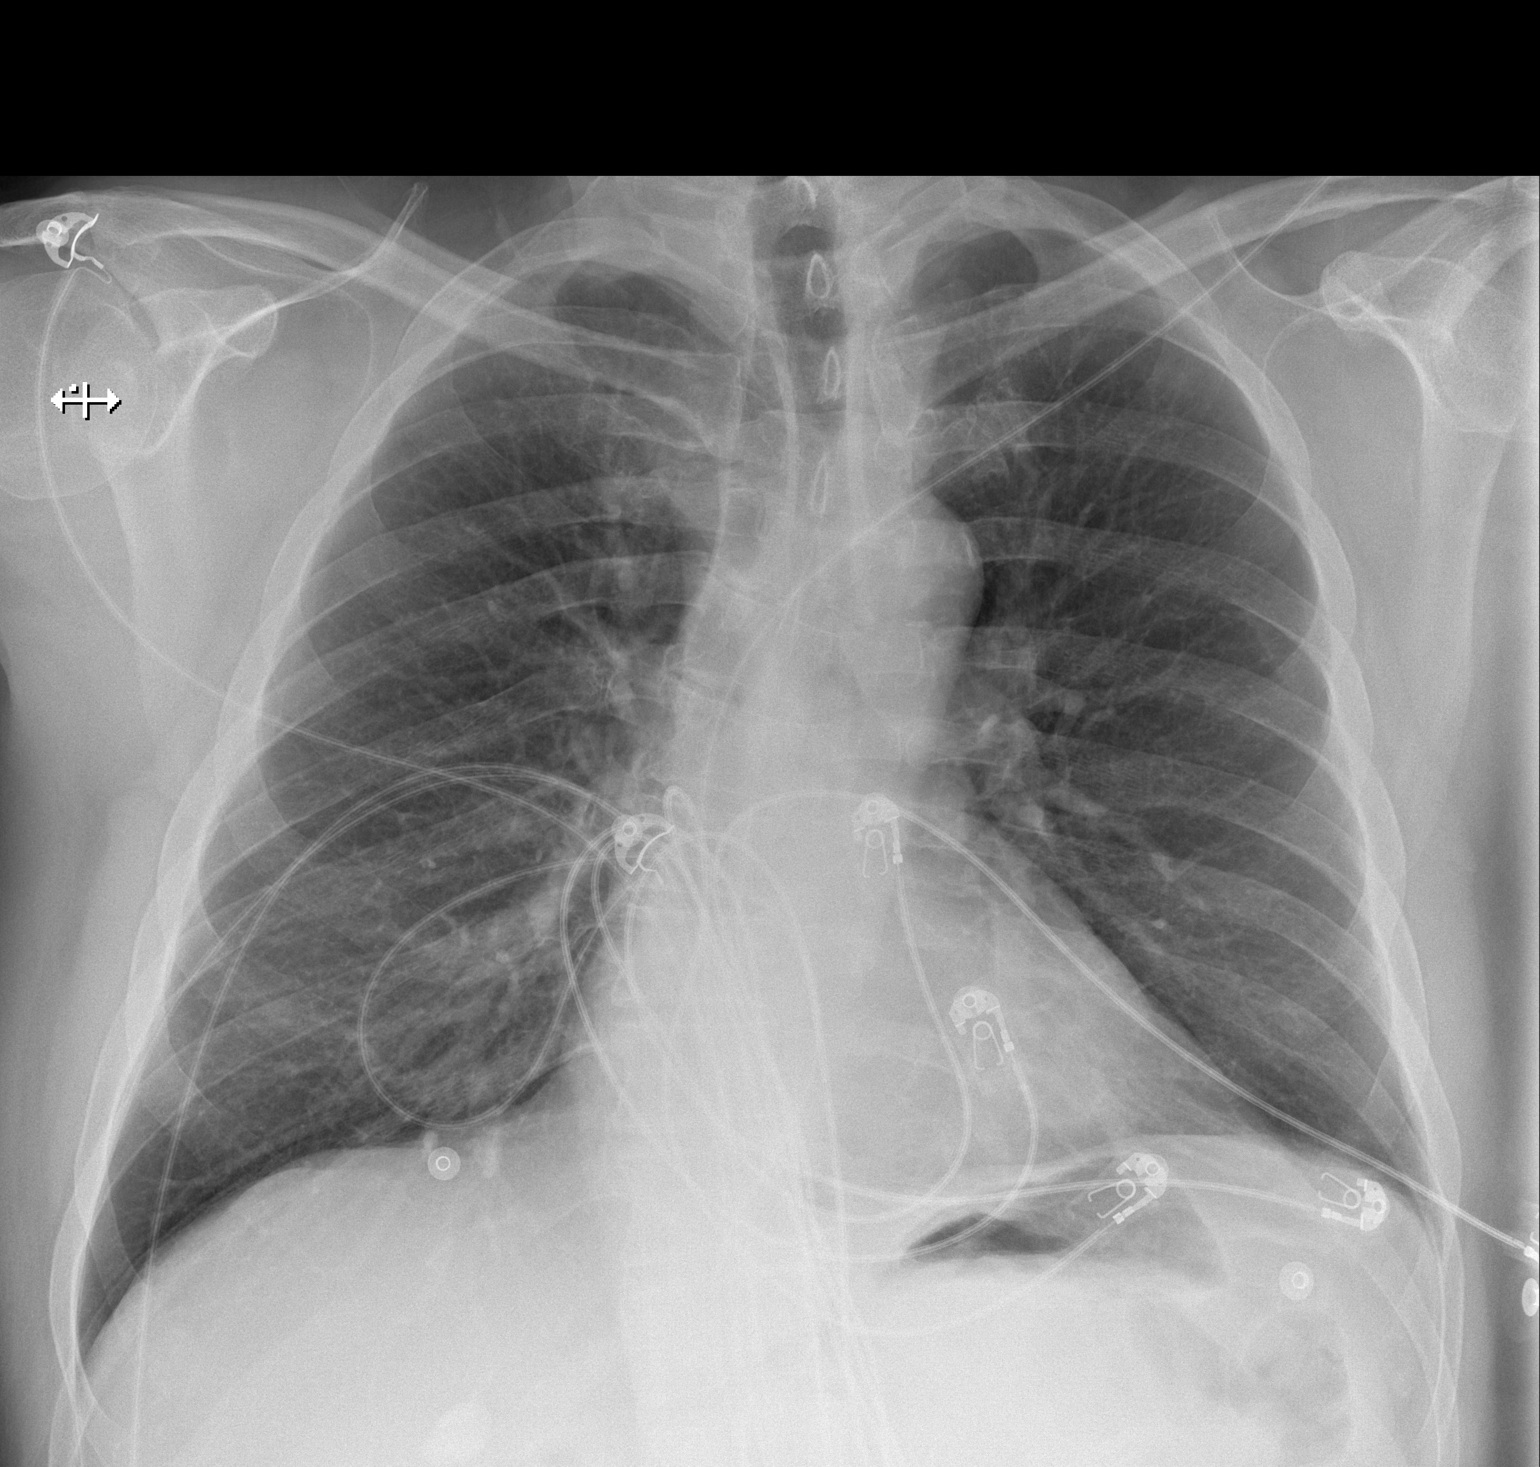

[w chest lat]
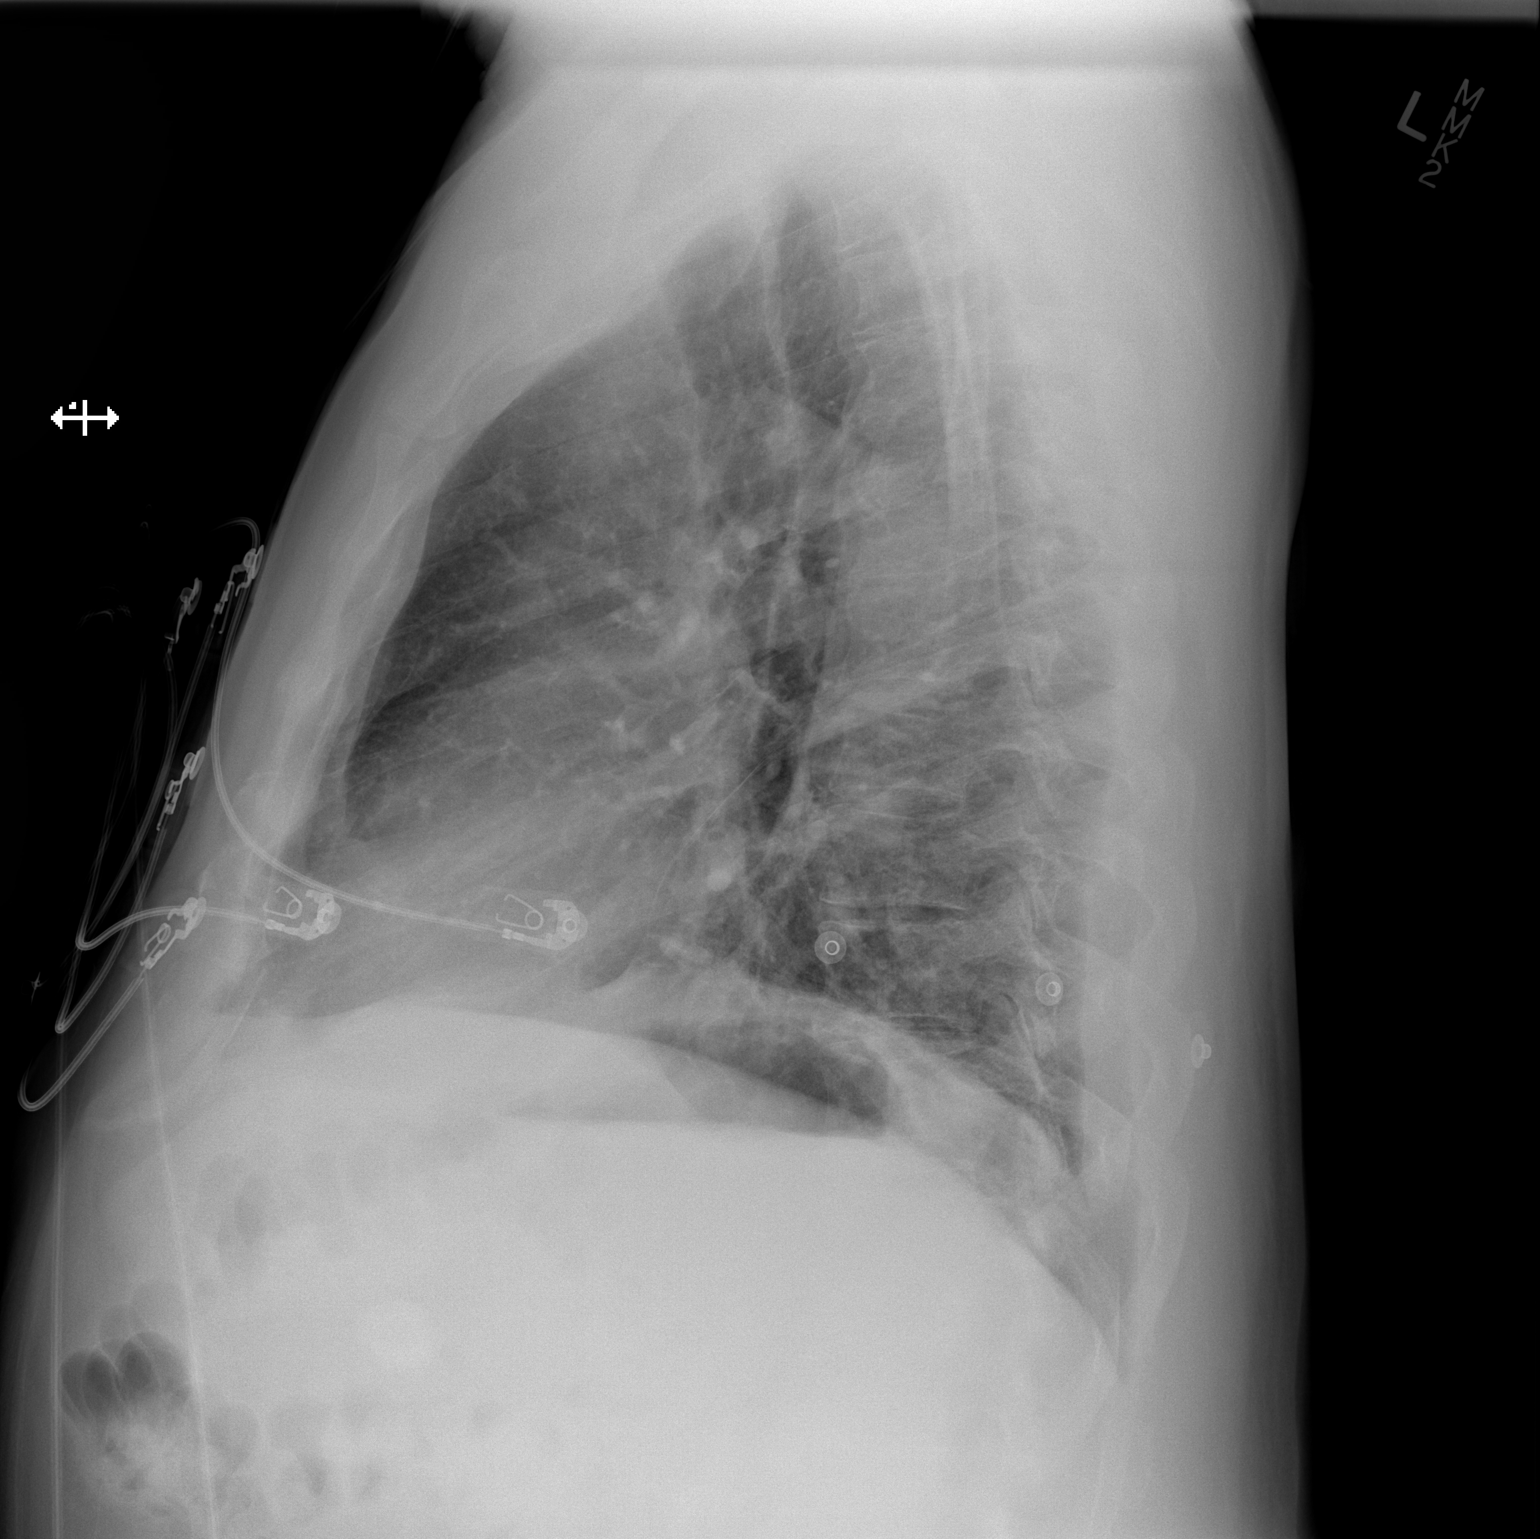

[2 of 2 positions shown; findings below may reference images not displayed]

FINDINGS: The lungs are clear. There is no pleural effusion or pneumothorax.
The cardiac silhouette is within normal limits. Lower thoracic
dextroscoliosis. No acute osseous pathology.
IMPRESSION: No active cardiopulmonary disease.

## 2019-11-06 DIAGNOSIS — Z1159 Encounter for screening for other viral diseases: Secondary | ICD-10-CM | POA: Diagnosis not present

## 2019-11-25 DIAGNOSIS — R0902 Hypoxemia: Secondary | ICD-10-CM | POA: Diagnosis not present

## 2020-02-04 ENCOUNTER — Encounter: Payer: Self-pay | Admitting: Cardiology

## 2020-02-04 ENCOUNTER — Ambulatory Visit: Payer: Medicare PPO | Admitting: Cardiology

## 2020-02-04 ENCOUNTER — Other Ambulatory Visit: Payer: Self-pay

## 2020-02-04 VITALS — BP 128/74 | HR 56 | Ht 72.75 in | Wt 247.0 lb

## 2020-02-04 DIAGNOSIS — I2510 Atherosclerotic heart disease of native coronary artery without angina pectoris: Secondary | ICD-10-CM | POA: Diagnosis not present

## 2020-02-04 DIAGNOSIS — E782 Mixed hyperlipidemia: Secondary | ICD-10-CM | POA: Diagnosis not present

## 2020-02-04 DIAGNOSIS — I4510 Unspecified right bundle-branch block: Secondary | ICD-10-CM

## 2020-02-04 DIAGNOSIS — I252 Old myocardial infarction: Secondary | ICD-10-CM | POA: Diagnosis not present

## 2020-02-04 NOTE — Patient Instructions (Signed)
Medication Instructions:  The current medical regimen is effective;  continue present plan and medications.  *If you need a refill on your cardiac medications before your next appointment, please call your pharmacy*  Follow-Up: At [facility], you and your health needs are our priority.  As part of our continuing mission to provide you with exceptional heart care, we have created designated Provider Care Teams.  These Care Teams include your primary Cardiologist (physician) and Advanced Practice Providers (APPs -  Physician Assistants and Nurse Practitioners) who all work together to provide you with the care you need, when you need it.  Your next appointment:   12 month(s)  The format for your next appointment:   In Person  Provider:   Mark Skains, MD   Thank you for choosing [facility]!!     

## 2020-02-04 NOTE — Progress Notes (Signed)
Cardiology Office Note:    Date:  02/04/2020   ID:  Tamer Greim, DOB 1946/07/24, MRN [id number]  PCP:  Lawerance Cruel, MD  Cardiologist:  Candee Furbish, MD  Electrophysiologist:  None   Referring MD: Lawerance Cruel, MD     History of Present Illness:    Matthew Nunez is a 74 y.o. male with coronary artery disease prior non-STEMI here for follow-up.  Had DES to RCA placed 04/29/2018.  Intermittent GERD-like anginal symptoms the weekend of the [place].  Felt short of breath with his dog walk.  Troponin was drawn in the outpatient setting and was abnormal and we he was sent to the emergency room.  His daughter is a Software engineer.  He is a retired Customer service manager.  He enjoys working at the [employer].  Okay to donate blood.  GERD at times TUMS. A fews ago, sitting at desk, few minutes of discomfort. ? Came to mind.   Past Medical History:  Diagnosis Date  . Coronary artery disease   . Dysthymia   . Mobitz type 1 second degree AV block    history of   . NSTEMI (non-ST elevated myocardial infarction) ([place])    04/30/18 PCI/DES to the mRCA, normal EF  . Varicose veins of legs     Past Surgical History:  Procedure Laterality Date  . CORONARY STENT INTERVENTION N/A 04/30/2018   Procedure: CORONARY STENT INTERVENTION;  Surgeon: Leonie Man, MD;  Location: [place] [facility];  Service: Cardiovascular;  Laterality: N/A;  . HERNIA REPAIR    . LEFT HEART CATH AND CORONARY ANGIOGRAPHY N/A 04/30/2018   Procedure: LEFT HEART CATH AND CORONARY ANGIOGRAPHY;  Surgeon: Leonie Man, MD;  Location: [place] [facility];  Service: Cardiovascular;  Laterality: N/A;  . VERICOSE VEIN SURGERY  2000    Current Medications: Current Meds  Medication Sig  . aspirin EC 81 MG tablet Take 81 mg by mouth daily.   Marland Kitchen atorvastatin (LIPITOR) 80 MG tablet Take 1 tablet (80 mg total) by mouth daily at 6 PM.  . Magnesium Hydroxide (MAGNESIA PO) Take 25 mg by mouth daily.  . metoprolol tartrate  (LOPRESSOR) 25 MG tablet TAKE 0.5 TABLETS (12.5 MG TOTAL) BY MOUTH EVERY 8 (EIGHT) HOURS.  . nitroGLYCERIN (NITROSTAT) 0.4 MG SL tablet PLACE 1 TABLET UNDER THE TONGUE EVERY 5 (FIVE) MINUTES X 3 DOSES AS NEEDED FOR CHEST PAIN.     Allergies:   Patient has no known allergies.   Social History   Socioeconomic History  . Marital status: Married    Spouse name: Not on file  . Number of children: Not on file  . Years of education: Not on file  . Highest education level: Not on file  Occupational History  . Not on file  Tobacco Use  . Smoking status: Never Smoker  . Smokeless tobacco: Never Used  Substance and Sexual Activity  . Alcohol use: Yes    Comment: 2 DRINKS A WEEK  . Drug use: No  . Sexual activity: Yes    Comment: MARRIED  Other Topics Concern  . Not on file  Social History Narrative  . Not on file   Social Determinants of Health   Financial Resource Strain:   . Difficulty of Paying Living Expenses: Not on file  Food Insecurity:   . Worried About Charity fundraiser in the Last Year: Not on file  . Ran Out of Food in the Last Year: Not on file  Transportation Needs:   . Film/video editor (Medical): Not on file  . Lack of Transportation (Non-Medical): Not on file  Physical Activity:   . Days of Exercise per Week: Not on file  . Minutes of Exercise per Session: Not on file  Stress:   . Feeling of Stress : Not on file  Social Connections:   . Frequency of Communication with Friends and Family: Not on file  . Frequency of Social Gatherings with Friends and Family: Not on file  . Attends Religious Services: Not on file  . Active Member of Clubs or Organizations: Not on file  . Attends Archivist Meetings: Not on file  . Marital Status: Not on file     Family History: The patient's family history includes Alzheimer's disease in his father; Cirrhosis in his brother; Healthy in his sister.  ROS:   Please see the history of present illness.    No  fevers chills nausea vomiting syncope bleeding all other systems reviewed and are negative.  EKGs/Labs/Other Studies Reviewed:    The following studies were reviewed today: Prior cardiac catheterization report, echocardiogram, lab work.  Echo 2017, 2019-normal EF no valvular abnormalities  Cardiac catheterization 04/30/2018: Diagnostic Dominance: Right  Intervention      EKG:  EKG is  ordered today.  The ekg ordered today demonstrates 02/04/2020-sinus bradycardia 56 right bundle branch block.  EKG previously shows right bundle branch block possible inferior infarct pattern.  He had subtle ST elevation noted in the inferior leads in the setting of RCA infarct.  Recent Labs: No results found for requested labs within last 8760 hours.  Recent Lipid Panel    Component Value Date/Time   CHOL 100 06/07/2018 0913   TRIG 47 06/07/2018 0913   HDL 43 06/07/2018 0913   CHOLHDL 2.3 06/07/2018 0913   CHOLHDL 3.1 05/01/2018 0226   VLDL 12 05/01/2018 0226   LDLCALC 48 06/07/2018 0913    Physical Exam:    VS:  BP 128/74   Pulse (!) 56   Ht 6' 0.75" (1.848 m)   Wt 247 lb (112 kg)   SpO2 94%   BMI 32.81 kg/m     Wt Readings from Last 3 Encounters:  02/04/20 247 lb (112 kg)  02/04/19 221 lb 6.4 oz (100.4 kg)  10/16/18 217 lb 9.5 oz (98.7 kg)     GEN:  Well nourished, well developed in no acute distress HEENT: Normal NECK: No JVD; No carotid bruits LYMPHATICS: No lymphadenopathy CARDIAC: RRR, no murmurs, rubs, gallops RESPIRATORY:  Clear to auscultation without rales, wheezing or rhonchi  ABDOMEN: Soft, non-tender, non-distended MUSCULOSKELETAL:  No edema; No deformity  SKIN: Warm and dry NEUROLOGIC:  Alert and oriented x 3 PSYCHIATRIC:  Normal affect   ASSESSMENT:    1. Coronary artery disease involving native coronary artery of native heart without angina pectoris    PLAN:    In order of problems listed above:  Coronary artery disease post MI RCA stent -Doing well  2019.  Brilinta stopped 1 year post stent.  Aspirin monotherapy.  Doing well.  Continue with beta-blocker, metoprolol.  Sometimes it can be hard for him to distinguish between GERD and heart pain.  He may take a nitroglycerin if necessary.  Continue to try to lose weight.  Diet, exercise.  Hyperlipidemia -High intensity statin therapy atorvastatin 80.  Continue with LDL checks.  Previously 26.  Right bundle branch block -Prior echocardiogram reassuring with no structural changes.  No syncope.  Varicose  veins -Previously he had seen [facility], Dr. Donnetta Hutching.  Continue with conservative management. Minor ankle edema, dependent. Hose.    Medication Adjustments/Labs and Tests Ordered: Current medicines are reviewed at length with the patient today.  Concerns regarding medicines are outlined above.  Orders Placed This Encounter  Procedures  . EKG 12-Lead   No orders of the defined types were placed in this encounter.   Patient Instructions  Medication Instructions:  The current medical regimen is effective;  continue present plan and medications.  *If you need a refill on your cardiac medications before your next appointment, please call your pharmacy*  Follow-Up: At [facility], you and your health needs are our priority.  As part of our continuing mission to provide you with exceptional heart care, we have created designated Provider Care Teams.  These Care Teams include your primary Cardiologist (physician) and Advanced Practice Providers (APPs -  Physician Assistants and Nurse Practitioners) who all work together to provide you with the care you need, when you need it.  Your next appointment:   12 month(s)  The format for your next appointment:   In Person  Provider:   Candee Furbish, MD  Thank you for choosing [facility]!!        Signed, Candee Furbish, MD  02/04/2020 10:24 AM    [place]

## 2020-03-17 ENCOUNTER — Other Ambulatory Visit: Payer: Self-pay | Admitting: Cardiology

## 2020-05-11 ENCOUNTER — Other Ambulatory Visit: Payer: Self-pay | Admitting: Cardiology

## 2020-08-11 DIAGNOSIS — H02846 Edema of left eye, unspecified eyelid: Secondary | ICD-10-CM | POA: Diagnosis not present

## 2020-08-11 DIAGNOSIS — H9202 Otalgia, left ear: Secondary | ICD-10-CM | POA: Diagnosis not present

## 2020-10-21 DIAGNOSIS — Z79899 Other long term (current) drug therapy: Secondary | ICD-10-CM | POA: Diagnosis not present

## 2020-10-21 DIAGNOSIS — Z0000 Encounter for general adult medical examination without abnormal findings: Secondary | ICD-10-CM | POA: Diagnosis not present

## 2020-10-21 DIAGNOSIS — L570 Actinic keratosis: Secondary | ICD-10-CM | POA: Diagnosis not present

## 2020-10-21 DIAGNOSIS — I2510 Atherosclerotic heart disease of native coronary artery without angina pectoris: Secondary | ICD-10-CM | POA: Diagnosis not present

## 2020-10-21 DIAGNOSIS — Z125 Encounter for screening for malignant neoplasm of prostate: Secondary | ICD-10-CM | POA: Diagnosis not present

## 2020-10-21 DIAGNOSIS — Z1211 Encounter for screening for malignant neoplasm of colon: Secondary | ICD-10-CM | POA: Diagnosis not present

## 2020-10-21 DIAGNOSIS — H6121 Impacted cerumen, right ear: Secondary | ICD-10-CM | POA: Diagnosis not present

## 2020-11-01 DIAGNOSIS — H6241 Otitis externa in other diseases classified elsewhere, right ear: Secondary | ICD-10-CM | POA: Diagnosis not present

## 2020-11-01 DIAGNOSIS — B369 Superficial mycosis, unspecified: Secondary | ICD-10-CM | POA: Diagnosis not present

## 2020-11-01 DIAGNOSIS — H6121 Impacted cerumen, right ear: Secondary | ICD-10-CM | POA: Diagnosis not present

## 2020-11-03 DIAGNOSIS — G4733 Obstructive sleep apnea (adult) (pediatric): Secondary | ICD-10-CM | POA: Diagnosis not present

## 2020-12-28 ENCOUNTER — Telehealth: Payer: Self-pay | Admitting: Cardiology

## 2020-12-28 NOTE — Telephone Encounter (Signed)
Matthew Nunez is calling wanting to know if Dr. Anne Fu is wanting him to have labs prior to or at his upcoming appointment. He states he only needs a callback if he is going to need them. Please advise.

## 2020-12-28 NOTE — Telephone Encounter (Signed)
Dr Anne Fu will determine if pt needs lab work at the time of the office visit.  If necessary, he will order and pt can have the same day.

## 2021-02-09 ENCOUNTER — Other Ambulatory Visit: Payer: Self-pay

## 2021-02-09 ENCOUNTER — Other Ambulatory Visit: Payer: Self-pay | Admitting: Cardiology

## 2021-02-09 ENCOUNTER — Encounter: Payer: Self-pay | Admitting: Cardiology

## 2021-02-09 ENCOUNTER — Ambulatory Visit: Payer: Medicare PPO | Admitting: Cardiology

## 2021-02-09 VITALS — BP 136/80 | HR 51 | Ht 72.75 in | Wt 252.0 lb

## 2021-02-09 DIAGNOSIS — R001 Bradycardia, unspecified: Secondary | ICD-10-CM

## 2021-02-09 DIAGNOSIS — E782 Mixed hyperlipidemia: Secondary | ICD-10-CM | POA: Diagnosis not present

## 2021-02-09 DIAGNOSIS — I2510 Atherosclerotic heart disease of native coronary artery without angina pectoris: Secondary | ICD-10-CM

## 2021-02-09 DIAGNOSIS — I4510 Unspecified right bundle-branch block: Secondary | ICD-10-CM | POA: Diagnosis not present

## 2021-02-09 NOTE — Progress Notes (Signed)
Cardiology Office Note:    Date:  02/09/2021   ID:  Matthew Nunez, DOB 07-Oct-1946, MRN [id number]  PCP:  Lawerance Cruel, [facility]  Cardiologist:  Candee Furbish, MD  Advanced Practice Provider:  No care team member to display Electrophysiologist:  None       Referring MD: Lawerance Cruel, MD     History of Present Illness:    Matthew Nunez is a 75 y.o. male here for the follow-up of coronary artery disease.  04/29/2018 DES to RCA Non-STEMI at that time.  Had GERD-like anginal symptoms the weekend of the [place] in 2019.  Felt short of breath with a dog walk.  Troponin was drawn in the outpatient setting and was abnormal and he was sent to the emergency room.  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  His daughter is a Software engineer in [place] at [place].  Occasionally will have GERD.  Feels some mild itchiness to his lower extremities, likely weather related, cold.    Past Medical History:  Diagnosis Date  . Coronary artery disease   . Dysthymia   . Mobitz type 1 second degree AV block    history of   . NSTEMI (non-ST elevated myocardial infarction) ([place])    04/30/18 PCI/DES to the mRCA, normal EF  . Varicose veins of legs     Past Surgical History:  Procedure Laterality Date  . CORONARY STENT INTERVENTION N/A 04/30/2018   Procedure: CORONARY STENT INTERVENTION;  Surgeon: Leonie Man, MD;  Location: [place] [facility];  Service: Cardiovascular;  Laterality: N/A;  . HERNIA REPAIR    . LEFT HEART CATH AND CORONARY ANGIOGRAPHY N/A 04/30/2018   Procedure: LEFT HEART CATH AND CORONARY ANGIOGRAPHY;  Surgeon: Leonie Man, MD;  Location: [place] [facility];  Service: Cardiovascular;  Laterality: N/A;  . VERICOSE VEIN SURGERY  2000    Current Medications: Current Meds  Medication Sig  . aspirin EC 81 MG tablet Take 81 mg by mouth daily.  Marland Kitchen  atorvastatin (LIPITOR) 80 MG tablet Take 1 tablet (80 mg total) by mouth daily at 6 PM.  . Magnesium Hydroxide (MAGNESIA PO) Take 25 mg by mouth daily.  . nitroGLYCERIN (NITROSTAT) 0.4 MG SL tablet PLACE 1 TABLET UNDER THE TONGUE EVERY 5 (FIVE) MINUTES X 3 DOSES AS NEEDED FOR CHEST PAIN.  . [DISCONTINUED] metoprolol tartrate (LOPRESSOR) 25 MG tablet TAKE 0.5 TABLETS (12.5 MG TOTAL) BY MOUTH EVERY 8 (EIGHT) HOURS.     Allergies:   Patient has no known allergies.   Social History   Socioeconomic History  . Marital status: Married    Spouse name: Not on file  . Number of children: Not on file  . Years of education: Not on file  . Highest education level: Not on file  Occupational History  . Not on file  Tobacco Use  . Smoking status: Never Smoker  . Smokeless tobacco: Never Used  Vaping Use  . Vaping Use: Never used  Substance and Sexual Activity  . Alcohol use: Yes    Comment: 2 DRINKS A WEEK  . Drug use: No  . Sexual activity: Yes    Comment: MARRIED  Other Topics Concern  . Not on file  Social History Narrative  . Not on file   Social Determinants of Health   Financial Resource Strain: Not on file  Food Insecurity:  Not on file  Transportation Needs: Not on file  Physical Activity: Not on file  Stress: Not on file  Social Connections: Not on file     Family History: The patient's family history includes Alzheimer's disease in his father; Cirrhosis in his brother; Healthy in his sister.  ROS:   Please see the history of present illness.     All other systems reviewed and are negative.  EKGs/Labs/Other Studies Reviewed:    The following studies were reviewed today:  Cardiac catheterization 04/30/2018: Diagnostic Dominance: Right  Intervention      EKG:  EKG is  ordered today.  The ekg ordered today demonstrates sinus bradycardia 51 with right bundle branch block, 02/04/2020-sinus bradycardia 56 right bundle branch block.   Recent Labs: No results found  for requested labs within last 8760 hours.  Recent Lipid Panel    Component Value Date/Time   CHOL 100 06/07/2018 0913   TRIG 47 06/07/2018 0913   HDL 43 06/07/2018 0913   CHOLHDL 2.3 06/07/2018 0913   CHOLHDL 3.1 05/01/2018 0226   VLDL 12 05/01/2018 0226   LDLCALC 48 06/07/2018 0913     Physical Exam:    VS:  BP 136/80 (BP Location: Left Arm, Patient Position: Sitting, Cuff Size: Normal)   Pulse (!) 51   Ht 6' 0.75" (1.848 m)   Wt 252 lb (114.3 kg)   SpO2 96%   BMI 33.48 kg/m     Wt Readings from Last 3 Encounters:  02/09/21 252 lb (114.3 kg)  02/04/20 247 lb (112 kg)  02/04/19 221 lb 6.4 oz (100.4 kg)     GEN:  Well nourished, well developed in no acute distress HEENT: Normal NECK: No JVD; No carotid bruits LYMPHATICS: No lymphadenopathy CARDIAC: RRR, no murmurs, rubs, gallops RESPIRATORY:  Clear to auscultation without rales, wheezing or rhonchi  ABDOMEN: Soft, non-tender, non-distended MUSCULOSKELETAL:  No edema; No deformity  SKIN: Warm and dry NEUROLOGIC:  Alert and oriented x 3 PSYCHIATRIC:  Normal affect   ASSESSMENT:    1. Bradycardia   2. Coronary artery disease involving native coronary artery of native heart without angina pectoris   3. Mixed hyperlipidemia   4. Right bundle branch block    PLAN:    In order of problems listed above:  Coronary artery disease post MI with RCA stent -2019, aspirin monotherapy now.  Previously Brilinta.  Given his right bundle branch block, ongoing bradycardia, we will go ahead and stop his low-dose metoprolol 12.5 mg.  -Monitor blood pressure.  If increase, may need telmisartan for instance.  He will let us know.  Hyperlipidemia -High intensity statin therapy with atorvastatin 80.  LDL 62.  Right bundle branch block -No structural changes.  Doing well.  No syncope.  Varicose veins -Previously has seen Dr. Donnetta Hutching.  Continue with conservative management.  Compression hose.      Medication Adjustments/Labs  and Tests Ordered: Current medicines are reviewed at length with the patient today.  Concerns regarding medicines are outlined above.  Orders Placed This Encounter  Procedures  . EKG 12-Lead   No orders of the defined types were placed in this encounter.   Patient Instructions  Medication Instructions:  Your physician has recommended you make the following change in your medication:  1-STOP Metoprolol  *If you need a refill on your cardiac medications before your next appointment, please call your pharmacy*  Lab Work: If you have labs (blood work) drawn today and your tests are completely normal, you will  receive your results only by: Marland Kitchen MyChart Message (if you have MyChart) OR . A paper copy in the mail If you have any lab test that is abnormal or we need to change your treatment, we will call you to review the results.  Testing/Procedures: None ordered today.   Follow-Up: At [facility], you and your health needs are our priority.  As part of our continuing mission to provide you with exceptional heart care, we have created designated Provider Care Teams.  These Care Teams include your primary Cardiologist (physician) and Advanced Practice Providers (APPs -  Physician Assistants and Nurse Practitioners) who all work together to provide you with the care you need, when you need it.  We recommend signing up for the patient portal called "MyChart".  Sign up information is provided on this After Visit Summary.  MyChart is used to connect with patients for Virtual Visits (Telemedicine).  Patients are able to view lab/test results, encounter notes, upcoming appointments, etc.  Non-urgent messages can be sent to your provider as well.   To learn more about what you can do with MyChart, go to [email or web address].    Your next appointment:   12 month(s)  The format for your next appointment:   In Person  Provider:   You may see Candee Furbish, MD or one of the following Advanced  Practice Providers on your designated Care Team:    Kathyrn Drown, NP        Signed, Candee Furbish, MD  02/09/2021 10:30 AM    [place]

## 2021-02-09 NOTE — Patient Instructions (Addendum)
Medication Instructions:  Your physician has recommended you make the following change in your medication:  1-STOP Metoprolol  *If you need a refill on your cardiac medications before your next appointment, please call your pharmacy*  Lab Work: If you have labs (blood work) drawn today and your tests are completely normal, you will receive your results only by: Marland Kitchen MyChart Message (if you have MyChart) OR . A paper copy in the mail If you have any lab test that is abnormal or we need to change your treatment, we will call you to review the results.  Testing/Procedures: None ordered today.   Follow-Up: At [facility], you and your health needs are our priority.  As part of our continuing mission to provide you with exceptional heart care, we have created designated Provider Care Teams.  These Care Teams include your primary Cardiologist (physician) and Advanced Practice Providers (APPs -  Physician Assistants and Nurse Practitioners) who all work together to provide you with the care you need, when you need it.  We recommend signing up for the patient portal called "MyChart".  Sign up information is provided on this After Visit Summary.  MyChart is used to connect with patients for Virtual Visits (Telemedicine).  Patients are able to view lab/test results, encounter notes, upcoming appointments, etc.  Non-urgent messages can be sent to your provider as well.   To learn more about what you can do with MyChart, go to [email or web address].    Your next appointment:   12 month(s)  The format for your next appointment:   In Person  Provider:   You may see Candee Furbish, MD or one of the following Advanced Practice Providers on your designated Care Team:    Kathyrn Drown, NP

## 2021-02-21 DIAGNOSIS — H25011 Cortical age-related cataract, right eye: Secondary | ICD-10-CM | POA: Diagnosis not present

## 2021-02-21 DIAGNOSIS — H52203 Unspecified astigmatism, bilateral: Secondary | ICD-10-CM | POA: Diagnosis not present

## 2021-02-21 DIAGNOSIS — H2513 Age-related nuclear cataract, bilateral: Secondary | ICD-10-CM | POA: Diagnosis not present

## 2021-02-21 DIAGNOSIS — H524 Presbyopia: Secondary | ICD-10-CM | POA: Diagnosis not present

## 2021-03-01 ENCOUNTER — Telehealth: Payer: Self-pay

## 2021-03-01 NOTE — Telephone Encounter (Signed)
   [facility] Pre-operative Risk Assessment    HEARTCARE STAFF: - Please ensure there is not already an duplicate clearance open for this procedure. - Under Visit Info/Reason for Call, type in Other and utilize the format Clearance MM/DD/YY or Clearance TBD. Do not use dashes or single digits. - If request is for dental extraction, please clarify the # of teeth to be extracted.  Request for surgical clearance:  1. What type of surgery is being performed? Colonoscopy  2. When is this surgery scheduled? 03/14/21   3. What type of clearance is required (medical clearance vs. Pharmacy clearance to hold med vs. Both)? Both  4. Are there any medications that need to be held prior to surgery and how long? ?Aspirin  5. Practice name and name of physician performing surgery? [facility], Dr.William Outlaw   6. What is the office phone number? [phone]   7.   What is the office fax number? [phone]  8.   Anesthesia type (None, local, MAC, general) ? General (propofol)   Matthew Nunez N Matthew Nunez 03/01/2021, 1:44 PM  _________________________________________________________________   (provider comments below)

## 2021-03-10 DIAGNOSIS — Z01812 Encounter for preprocedural laboratory examination: Secondary | ICD-10-CM | POA: Diagnosis not present

## 2021-03-14 DIAGNOSIS — K5730 Diverticulosis of large intestine without perforation or abscess without bleeding: Secondary | ICD-10-CM | POA: Diagnosis not present

## 2021-03-14 DIAGNOSIS — Z1211 Encounter for screening for malignant neoplasm of colon: Secondary | ICD-10-CM | POA: Diagnosis not present

## 2021-03-14 DIAGNOSIS — K648 Other hemorrhoids: Secondary | ICD-10-CM | POA: Diagnosis not present

## 2021-04-28 IMAGING — US US THYROID
1 series · 13 of 25 positions shown · non-contrast
Comparison: None.

CLINICAL DATA: Goiter.

EXAM:
THYROID ULTRASOUND
TECHNIQUE: Ultrasound examination of the thyroid gland and adjacent soft
tissues was performed.

[Series 1: us thyroid · 0.12mm/px · 13 of 66 slices shown]
[im 1/66]
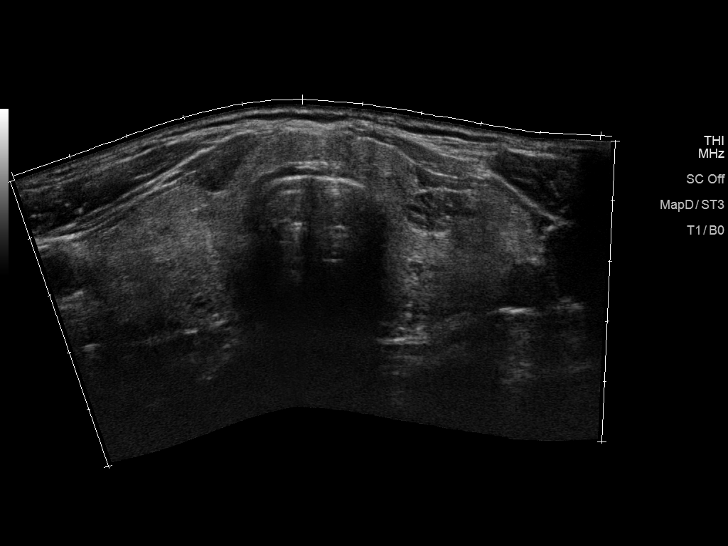
[im 6/66]
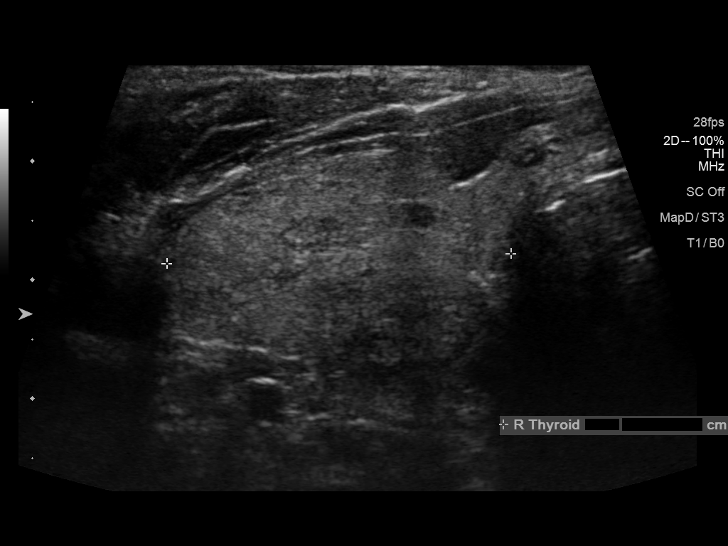
[im 11/66]
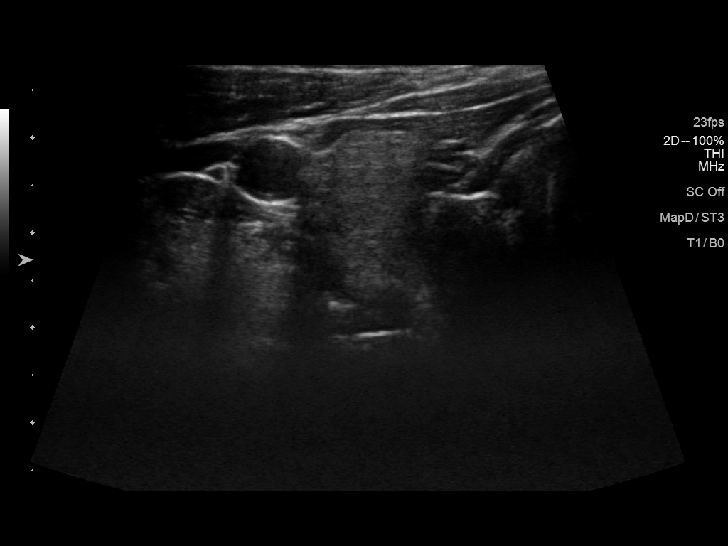
[im 17/66]
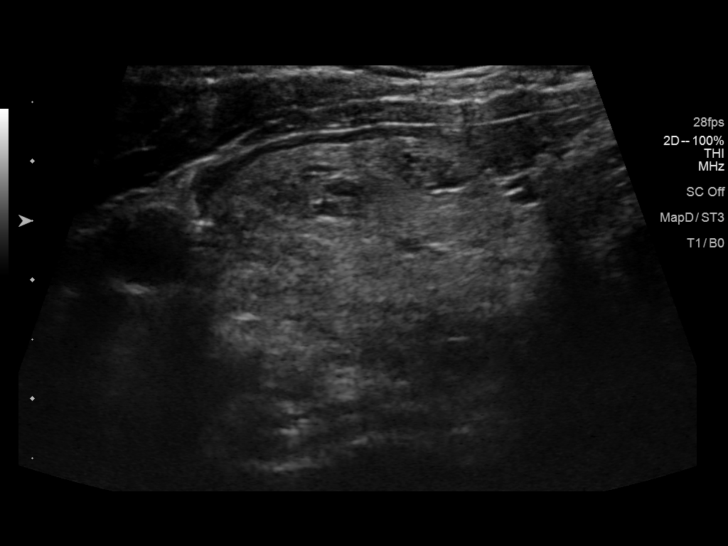
[im 22/66]
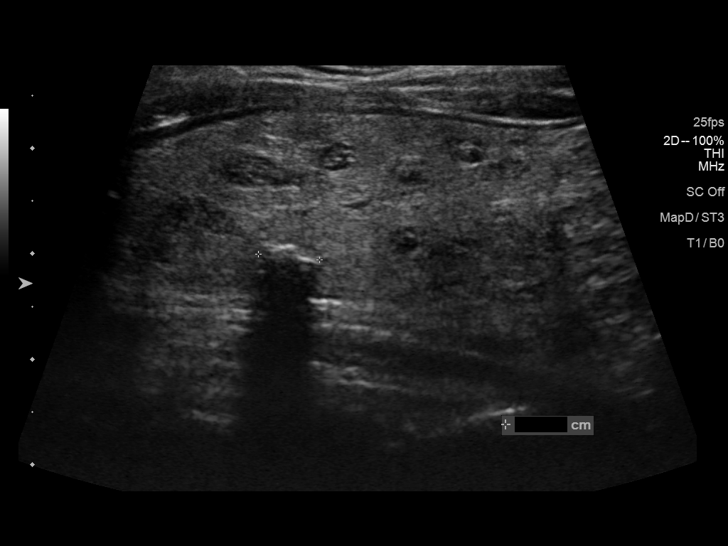
[im 28/66]
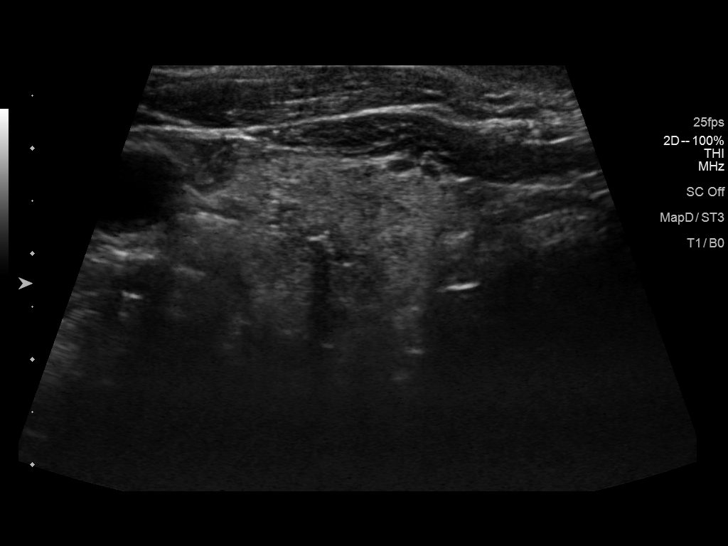
[im 33/66]
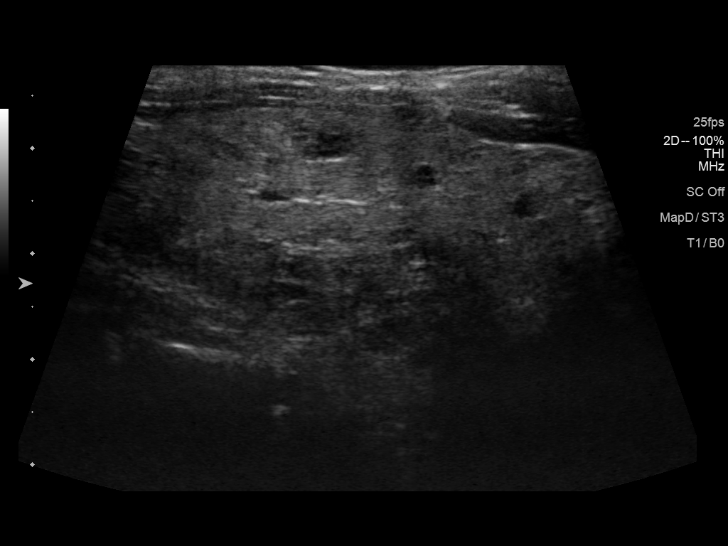
[im 38/66]
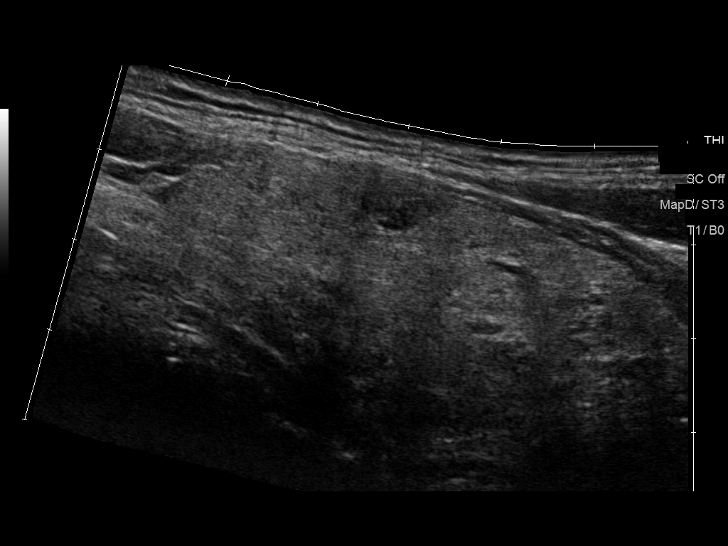
[im 44/66]
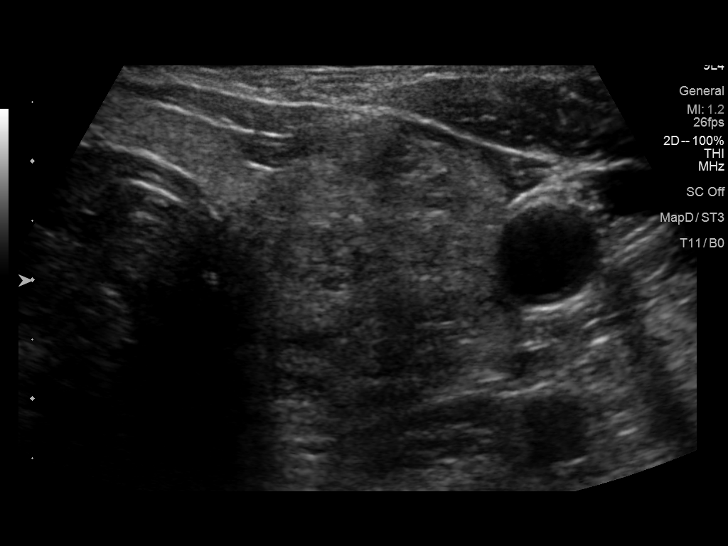
[im 49/66]
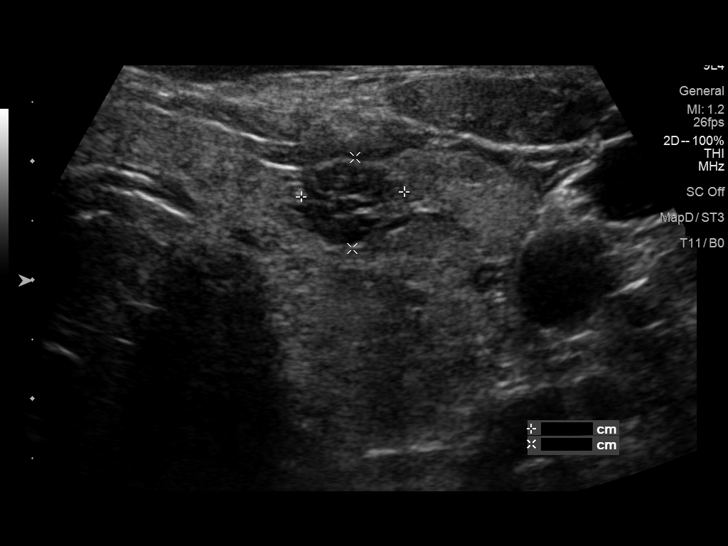
[im 55/66]
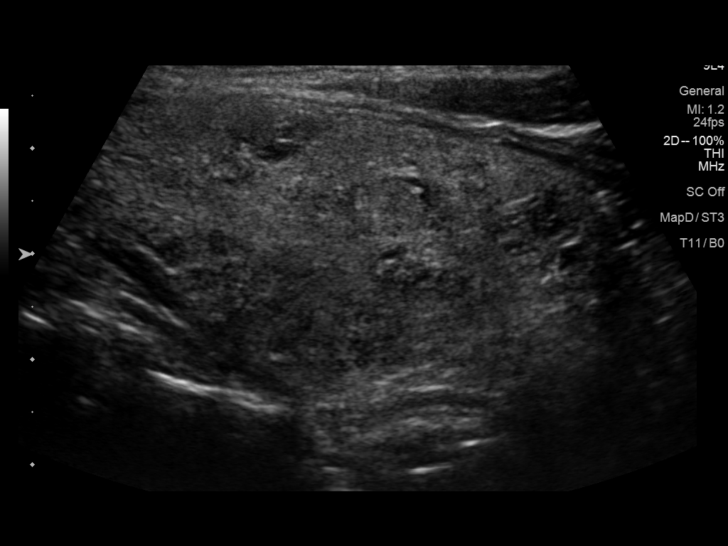
[im 60/66]
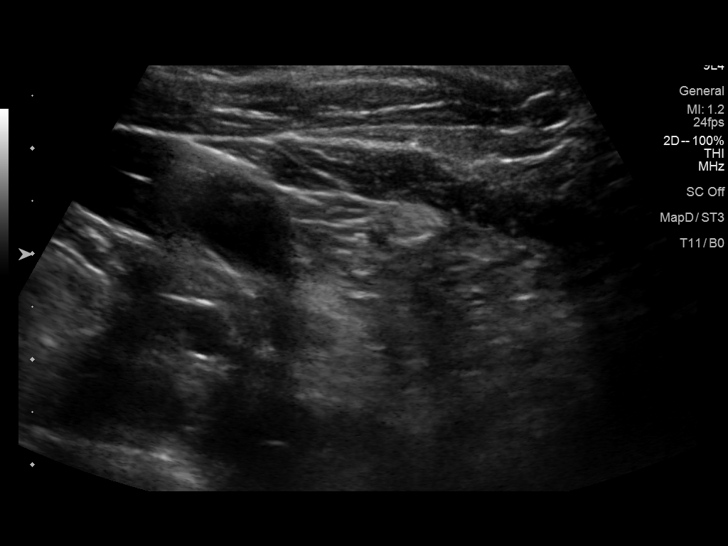
[im 66/66]
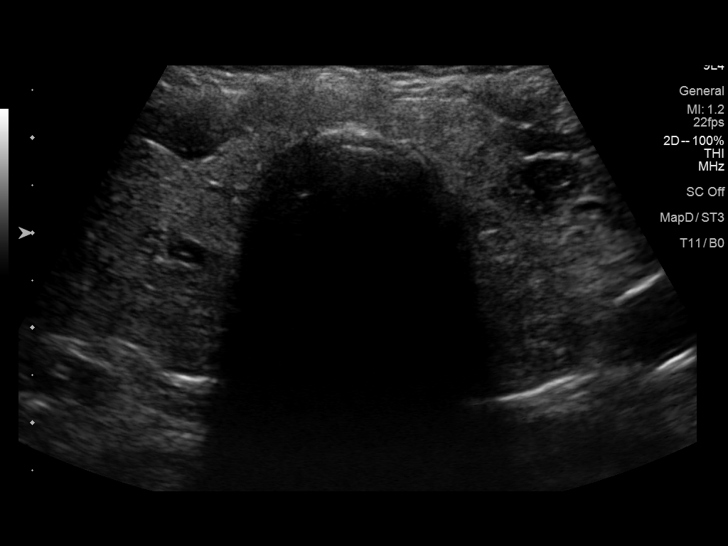

[13 of 25 positions shown; findings below may reference images not displayed]

FINDINGS: Parenchymal Echotexture: Moderately heterogenous

Isthmus: 5 mm

Right lobe: 7.2 x 2.8 x 2.9 cm

Left lobe: 6.1 x 2.9 x 2.5 cm

_________________________________________________________

Estimated total number of nodules >/= 1 cm: 6-10

Number of spongiform nodules >/=  2 cm not described below (TR1): 0

Number of mixed cystic and solid nodules >/= 1.5 cm not described
below (TR2): 0

_________________________________________________________

Nodule # 6:

Location: Left; Mid

Maximum size: 1.0 cm; Other 2 dimensions: 0.9 x 0.6 cm

Composition: solid/almost completely solid (2)

Echogenicity: hypoechoic (2)

Shape: not taller-than-wide (0)

Margins: smooth (0)

Echogenic foci: none (0)

ACR TI-RADS total points: 4.

ACR TI-RADS risk category: TR4 (4-6 points).

ACR TI-RADS recommendations:

*Given size (>/= 1 - 1.4 cm) and appearance, a follow-up ultrasound
in 1 year should be considered based on TI-RADS criteria.

_________________________________________________________

Nodule # 7:

Location: Left; Mid

Maximum size: 2.7 cm; Other 2 dimensions: 2.0 x 1.3 cm

Composition: solid/almost completely solid (2)

Echogenicity: hypoechoic (2)

Shape: not taller-than-wide (0)

Margins: ill-defined (0)

Echogenic foci: none (0)

ACR TI-RADS total points: 4.

ACR TI-RADS risk category: TR4 (4-6 points).

ACR TI-RADS recommendations:

**Given size (>/= 1.5 cm) and appearance, fine needle aspiration of
this moderately suspicious nodule should be considered based on
TI-RADS criteria.

_________________________________________________________

There are several benign-appearing right thyroid nodules including a
1 cm isoechoic nodule, 1.2 cm mixed cystic/solid nodule, 1.1 cm
mixed cystic/solid nodule, and 6 mm dystrophic calcification. None
of these would meet criteria for biopsy or follow-up and are not
fully described by TI rads criteria.

Right lobe also demonstrates background pseudo nodularity in the
midpole region.

Also in the left inferior thyroid, there is a 1.6 cm spongiform
nodule which is well-circumscribed and also does not meet criteria
for biopsy or follow-up.
IMPRESSION: 2.7 cm left mid thyroid TR 4 nodule meets criteria for biopsy as
above.

1 cm left mid thyroid TR 4 nodule meets criteria follow-up in 1
year.

Moderate gland heterogeneity and additional benign-appearing nodules
with background pseudo nodularity noted as above.

The above is in keeping with the ACR TI-RADS recommendations - [HOSPITAL] 4506;[DATE].

## 2021-05-06 DIAGNOSIS — D2362 Other benign neoplasm of skin of left upper limb, including shoulder: Secondary | ICD-10-CM | POA: Diagnosis not present

## 2021-05-06 DIAGNOSIS — D225 Melanocytic nevi of trunk: Secondary | ICD-10-CM | POA: Diagnosis not present

## 2021-05-06 DIAGNOSIS — L821 Other seborrheic keratosis: Secondary | ICD-10-CM | POA: Diagnosis not present

## 2021-05-06 DIAGNOSIS — L570 Actinic keratosis: Secondary | ICD-10-CM | POA: Diagnosis not present

## 2021-10-27 DIAGNOSIS — Z125 Encounter for screening for malignant neoplasm of prostate: Secondary | ICD-10-CM | POA: Diagnosis not present

## 2021-10-27 DIAGNOSIS — Z79899 Other long term (current) drug therapy: Secondary | ICD-10-CM | POA: Diagnosis not present

## 2021-10-27 DIAGNOSIS — I2510 Atherosclerotic heart disease of native coronary artery without angina pectoris: Secondary | ICD-10-CM | POA: Diagnosis not present

## 2021-11-02 DIAGNOSIS — G4733 Obstructive sleep apnea (adult) (pediatric): Secondary | ICD-10-CM | POA: Diagnosis not present

## 2021-11-03 DIAGNOSIS — Z79899 Other long term (current) drug therapy: Secondary | ICD-10-CM | POA: Diagnosis not present

## 2021-11-03 DIAGNOSIS — Z125 Encounter for screening for malignant neoplasm of prostate: Secondary | ICD-10-CM | POA: Diagnosis not present

## 2021-11-03 DIAGNOSIS — I2510 Atherosclerotic heart disease of native coronary artery without angina pectoris: Secondary | ICD-10-CM | POA: Diagnosis not present

## 2021-11-03 DIAGNOSIS — Z0000 Encounter for general adult medical examination without abnormal findings: Secondary | ICD-10-CM | POA: Diagnosis not present

## 2022-02-23 DIAGNOSIS — H2513 Age-related nuclear cataract, bilateral: Secondary | ICD-10-CM | POA: Diagnosis not present

## 2022-02-23 DIAGNOSIS — H524 Presbyopia: Secondary | ICD-10-CM | POA: Diagnosis not present

## 2022-02-23 DIAGNOSIS — H52203 Unspecified astigmatism, bilateral: Secondary | ICD-10-CM | POA: Diagnosis not present

## 2022-03-06 DIAGNOSIS — D485 Neoplasm of uncertain behavior of skin: Secondary | ICD-10-CM | POA: Diagnosis not present

## 2022-03-06 DIAGNOSIS — L570 Actinic keratosis: Secondary | ICD-10-CM | POA: Diagnosis not present

## 2022-03-06 DIAGNOSIS — D0439 Carcinoma in situ of skin of other parts of face: Secondary | ICD-10-CM | POA: Diagnosis not present

## 2022-03-08 NOTE — Progress Notes (Signed)
?Cardiology Office Note:   ? ?Date:  03/09/2022  ? ?ID:  Matthew Nunez, DOB 07-26-1946, MRN [id number] ? ?PCP:  Lawerance Cruel, MD ?  ?[place]  ?Cardiologist:  Candee Furbish, MD  ?Advanced Practice Provider:  No care team member to display ?Electrophysiologist:  None  ?    ? ?Referring MD: Lawerance Cruel, MD  ? ? ? ?History of Present Illness:   ? ?Matthew Nunez is a 76 y.o. male here for the follow-up of coronary artery disease. ? ?04/29/2018 DES to RCA ?Non-STEMI at that time. ? ?Had GERD-like anginal symptoms the weekend of the [place] in 2019.  Felt short of breath with a dog walk.  Troponin was drawn in the outpatient setting and was abnormal and he was sent to the emergency room. ? 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  His daughter is a Software engineer in [place] at [place]. ? ?Today, he is doing well. He has a bandage on his R forehead below his hair line after a recent dermatological procedure with Dr. Ronnald Ramp. ? ?A couple months ago, he reports an episode of chest pain that almost caused him to take a nitroglycerin. He describes the pain as a "funny feeling" in his central chest. He took 2 Tums tablets and the pain resolved and has not recurred. ? ?He endorses bilateral LE edema. He has noticed his socks will leave deep indents on his legs. He does not own compression sock but he does have socks that extend over his knees.  ? ?He donates blood regularly and notices his blood pressure ranges from 027O to 536U systolic prior to donating. He does not check his blood pressure at home.  ? 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  ? ?He denies any palpitations, shortness of breath, lightheadedness, headaches, syncope, orthopnea, PND, or exertional symptoms. ? ?Past Medical History:  ?Diagnosis Date  ? Coronary artery disease   ? Dysthymia   ? Mobitz  type 1 second degree AV block   ? history of   ? NSTEMI (non-ST elevated myocardial infarction) ([place])   ? 04/30/18 PCI/DES to the mRCA, normal EF  ? Varicose veins of legs   ? ? ?Past Surgical History:  ?Procedure Laterality Date  ? CORONARY STENT INTERVENTION N/A 04/30/2018  ? Procedure: CORONARY STENT INTERVENTION;  Surgeon: Leonie Man, MD;  Location: [place] [facility];  Service: Cardiovascular;  Laterality: N/A;  ? HERNIA REPAIR    ? LEFT HEART CATH AND CORONARY ANGIOGRAPHY N/A 04/30/2018  ? Procedure: LEFT HEART CATH AND CORONARY ANGIOGRAPHY;  Surgeon: Leonie Man, MD;  Location: [place] [facility];  Service: Cardiovascular;  Laterality: N/A;  ? [place]  ? ? ?Current Medications: ?Current Meds  ?Medication Sig  ? aspirin EC 81 MG tablet Take 81 mg by mouth daily.  ? atorvastatin (LIPITOR) 80 MG tablet Take 1 tablet (80 mg total) by mouth daily at 6 PM.  ? Magnesium Hydroxide (MAGNESIA PO) Take 250 mg by mouth daily.  ? nitroGLYCERIN (NITROSTAT) 0.4 MG SL tablet PLACE 1 TABLET UNDER THE TONGUE EVERY 5 (FIVE) MINUTES X 3 DOSES AS NEEDED FOR CHEST PAIN.  ?  ? ?Allergies:   Patient has no known allergies.  ? ?Social History  ? ?Socioeconomic History  ? Marital status: Married  ?  Spouse name:  Not on file  ? Number of children: Not on file  ? Years of education: Not on file  ? Highest education level: Not on file  ?Occupational History  ? Not on file  ?Tobacco Use  ? Smoking status: Never  ? Smokeless tobacco: Never  ?Vaping Use  ? Vaping Use: Never used  ?Substance and Sexual Activity  ? Alcohol use: Yes  ?  Comment: 2 DRINKS A WEEK  ? Drug use: No  ? Sexual activity: Yes  ?  Comment: MARRIED  ?Other Topics Concern  ? Not on file  ?Social History Narrative  ? Not on file  ? ?Social Determinants of Health  ? ?Financial Resource Strain: Not on file  ?Food Insecurity: Not on file  ?Transportation Needs: Not on file  ?Physical Activity: Not on file  ?Stress: Not on file  ?Social Connections:  Not on file  ?  ? ?Family History: ?The patient's family history includes Alzheimer's disease in his father; Cirrhosis in his brother; Healthy in his sister. ? ?ROS:   ?Please see the history of present illness.   ?(+) Chest pain ?(+) Bilateral LE edema  ?All other systems reviewed and are negative. ? ?EKGs/Labs/Other Studies Reviewed:   ? ?The following studies were reviewed today: ?Echo 05/01/18 ?Study Conclusions  ? ?- Left ventricle: The cavity size was normal. Wall thickness was  ?  increased in a pattern of mild LVH. Systolic function was normal.  ?  The estimated ejection fraction was in the range of 55% to 60%.  ?  Wall motion was normal; there were no regional wall motion  ?  abnormalities. Doppler parameters are consistent with abnormal  ?  left ventricular relaxation (grade 1 diastolic dysfunction). The  ?  E/e&' ratio is between 8-15, suggesting indeterminate LV filling  ?  pressure.  ?- Mitral valve: Calcified annulus. Mildly thickened leaflets .  ?  There was mild regurgitation.  ?- Left atrium: The atrium was normal in size.  ?- Right atrium: The atrium was mildly dilated.  ?- Inferior vena cava: The vessel was dilated. The respirophasic  ?  diameter changes were blunted (< 50%), consistent with elevated  ?  central venous pressure.  ? ?Impressions:  ? ?- Compared to a prior study in 2017, there are few changes.  ? ?Cardiac catheterization 04/30/2018: ?Diagnostic ?Dominance: Right ? ?Intervention ?  ? ?  ?  ? ?EKG:  EKG was personally reviewed ?03/09/22: Sinus rhythm, rate 68 bpm; RBBB and first degree AV-block ?02/09/21: sinus bradycardia 51 with right bundle branch block ?02/04/20: sinus bradycardia 56 right bundle branch block.  ? ?Recent Labs: ?No results found for requested labs within last 8760 hours.  ?Recent Lipid Panel ?   ?Component Value Date/Time  ? CHOL 100 06/07/2018 0913  ? TRIG 47 06/07/2018 0913  ? HDL 43 06/07/2018 0913  ? CHOLHDL 2.3 06/07/2018 0913  ? CHOLHDL 3.1 05/01/2018 0226  ? VLDL  12 05/01/2018 0226  ? [place] 48 06/07/2018 0913  ? ? ? ?Physical Exam:   ? ?VS:  BP (!) 142/80   Pulse 68   Ht 6' 0.75" (1.848 m)   Wt 253 lb 3.2 oz (114.9 kg)   SpO2 95%   BMI 33.64 kg/m?    ? ?Wt Readings from Last 3 Encounters:  ?03/09/22 253 lb 3.2 oz (114.9 kg)  ?02/09/21 252 lb (114.3 kg)  ?02/04/20 247 lb (112 kg)  ?  ? ?GEN:  Well nourished, well developed in no acute  distress ?HEENT: Normal ?NECK: No JVD; No carotid bruits ?LYMPHATICS: No lymphadenopathy ?CARDIAC: RRR, no murmurs, rubs, gallops ?RESPIRATORY:  Clear to auscultation without rales, wheezing or rhonchi  ?ABDOMEN: Soft, non-tender, non-distended ?MUSCULOSKELETAL: bilateral mild edema; No deformity  ?SKIN: Warm and dry ?NEUROLOGIC:  Alert and oriented x 3 ?PSYCHIATRIC:  Normal affect  ? ?ASSESSMENT:   ? ?1. Coronary artery disease involving native coronary artery of native heart without angina pectoris   ?2. History of myocardial infarction   ?3. Right bundle branch block   ?4. Mixed hyperlipidemia   ?5. Elevated blood pressure reading   ?6. Lower extremity edema   ? ? ?PLAN:   ? ?Coronary artery disease involving native coronary artery of native heart without angina pectoris ?Continue with goal-directed medical therapy.  On aspirin 81 mg.  On atorvastatin 80 mg a day.  LDL 66.  Continue with diet, exercise.  One isolated episode of chest discomfort.  No recurrence.  No exertional symptoms currently.  If symptoms do recur or become more worrisome, we will have low threshold for further cardiac testing. ? ?History of myocardial infarction ?Prior RCA stent. ? ?Right bundle branch block ?Stable on EKG with first-degree AV block. ? ?Hyperlipidemia ?Atorvastatin 80 mg.  Excellent LDL 76.  ALT 21.  Creatinine 0.9. ? ?Elevated blood pressure reading ?He will continue to monitor at home periodically.  Obviously if his pressures are in the 140s we will go ahead and start antihypertensive probably ARB.  Also would advocate weight loss ? ?Lower  extremity edema ?Noted lower extremity edema.  Dependent edema.  Compression stockings leg elevation.  Watch salt and fluids.  Weight loss. ? ? ?  ? ? ?Medication Adjustments/Labs and Tests Ordered: ?Current medicines

## 2022-03-09 ENCOUNTER — Ambulatory Visit: Payer: Medicare PPO | Admitting: Cardiology

## 2022-03-09 ENCOUNTER — Other Ambulatory Visit: Payer: Self-pay

## 2022-03-09 ENCOUNTER — Encounter: Payer: Self-pay | Admitting: Cardiology

## 2022-03-09 VITALS — BP 142/80 | HR 68 | Ht 72.75 in | Wt 253.2 lb

## 2022-03-09 DIAGNOSIS — I2510 Atherosclerotic heart disease of native coronary artery without angina pectoris: Secondary | ICD-10-CM

## 2022-03-09 DIAGNOSIS — I252 Old myocardial infarction: Secondary | ICD-10-CM | POA: Diagnosis not present

## 2022-03-09 DIAGNOSIS — I4510 Unspecified right bundle-branch block: Secondary | ICD-10-CM

## 2022-03-09 DIAGNOSIS — R600 Localized edema: Secondary | ICD-10-CM | POA: Diagnosis not present

## 2022-03-09 DIAGNOSIS — E782 Mixed hyperlipidemia: Secondary | ICD-10-CM | POA: Diagnosis not present

## 2022-03-09 DIAGNOSIS — R030 Elevated blood-pressure reading, without diagnosis of hypertension: Secondary | ICD-10-CM | POA: Diagnosis not present

## 2022-03-09 NOTE — Patient Instructions (Signed)
Medication Instructions:  The current medical regimen is effective;  continue present plan and medications.  *If you need a refill on your cardiac medications before your next appointment, please call your pharmacy*  Follow-Up: At [facility], you and your health needs are our priority.  As part of our continuing mission to provide you with exceptional heart care, we have created designated Provider Care Teams.  These Care Teams include your primary Cardiologist (physician) and Advanced Practice Providers (APPs -  Physician Assistants and Nurse Practitioners) who all work together to provide you with the care you need, when you need it.  We recommend signing up for the patient portal called "MyChart".  Sign up information is provided on this After Visit Summary.  MyChart is used to connect with patients for Virtual Visits (Telemedicine).  Patients are able to view lab/test results, encounter notes, upcoming appointments, etc.  Non-urgent messages can be sent to your provider as well.   To learn more about what you can do with MyChart, go to [email or web address].    Your next appointment:   1 year(s)  The format for your next appointment:   In Person  Provider:   Mark Skains, MD   Thank you for choosing [facility]!!    

## 2022-03-09 NOTE — Assessment & Plan Note (Signed)
Continue with goal-directed medical therapy.  On aspirin 81 mg.  On atorvastatin 80 mg a day.  LDL 66.  Continue with diet, exercise.  One isolated episode of chest discomfort.  No recurrence.  No exertional symptoms currently.  If symptoms do recur or become more worrisome, we will have low threshold for further cardiac testing. ?

## 2022-03-09 NOTE — Assessment & Plan Note (Signed)
He will continue to monitor at home periodically.  Obviously if his pressures are in the 140s we will go ahead and start antihypertensive probably ARB.  Also would advocate weight loss ?

## 2022-03-09 NOTE — Assessment & Plan Note (Signed)
Noted lower extremity edema.  Dependent edema.  Compression stockings leg elevation.  Watch salt and fluids.  Weight loss. ?

## 2022-03-09 NOTE — Assessment & Plan Note (Signed)
Atorvastatin 80 mg.  Excellent LDL 76.  ALT 21.  Creatinine 0.9. ?

## 2022-03-09 NOTE — Assessment & Plan Note (Signed)
Prior RCA stent. ?

## 2022-03-09 NOTE — Assessment & Plan Note (Signed)
Stable on EKG with first-degree AV block. ?

## 2022-08-18 DIAGNOSIS — Z0184 Encounter for antibody response examination: Secondary | ICD-10-CM | POA: Diagnosis not present

## 2022-09-06 DIAGNOSIS — D225 Melanocytic nevi of trunk: Secondary | ICD-10-CM | POA: Diagnosis not present

## 2022-09-06 DIAGNOSIS — L821 Other seborrheic keratosis: Secondary | ICD-10-CM | POA: Diagnosis not present

## 2022-09-06 DIAGNOSIS — D485 Neoplasm of uncertain behavior of skin: Secondary | ICD-10-CM | POA: Diagnosis not present

## 2022-09-06 DIAGNOSIS — L570 Actinic keratosis: Secondary | ICD-10-CM | POA: Diagnosis not present

## 2022-09-14 ENCOUNTER — Other Ambulatory Visit: Payer: Self-pay

## 2022-09-14 MED ORDER — NITROGLYCERIN 0.4 MG SL SUBL: SUBLINGUAL_TABLET | SUBLINGUAL | 5 refills | Status: DC

## 2022-09-14 NOTE — Telephone Encounter (Signed)
Pt's medication was sent to pt's pharmacy as requested. Confirmation received.  °

## 2022-11-09 DIAGNOSIS — Z79899 Other long term (current) drug therapy: Secondary | ICD-10-CM | POA: Diagnosis not present

## 2022-11-09 DIAGNOSIS — I2510 Atherosclerotic heart disease of native coronary artery without angina pectoris: Secondary | ICD-10-CM | POA: Diagnosis not present

## 2022-11-09 DIAGNOSIS — Z125 Encounter for screening for malignant neoplasm of prostate: Secondary | ICD-10-CM | POA: Diagnosis not present

## 2022-11-21 DIAGNOSIS — Z125 Encounter for screening for malignant neoplasm of prostate: Secondary | ICD-10-CM | POA: Diagnosis not present

## 2022-11-21 DIAGNOSIS — I2510 Atherosclerotic heart disease of native coronary artery without angina pectoris: Secondary | ICD-10-CM | POA: Diagnosis not present

## 2022-11-21 DIAGNOSIS — Z6834 Body mass index (BMI) 34.0-34.9, adult: Secondary | ICD-10-CM | POA: Diagnosis not present

## 2022-11-21 DIAGNOSIS — N529 Male erectile dysfunction, unspecified: Secondary | ICD-10-CM | POA: Diagnosis not present

## 2022-11-21 DIAGNOSIS — I10 Essential (primary) hypertension: Secondary | ICD-10-CM | POA: Diagnosis not present

## 2022-11-21 DIAGNOSIS — Z0000 Encounter for general adult medical examination without abnormal findings: Secondary | ICD-10-CM | POA: Diagnosis not present

## 2022-11-29 DIAGNOSIS — I10 Essential (primary) hypertension: Secondary | ICD-10-CM | POA: Diagnosis not present

## 2022-11-29 DIAGNOSIS — G4733 Obstructive sleep apnea (adult) (pediatric): Secondary | ICD-10-CM | POA: Diagnosis not present

## 2022-11-29 DIAGNOSIS — I2510 Atherosclerotic heart disease of native coronary artery without angina pectoris: Secondary | ICD-10-CM | POA: Diagnosis not present

## 2022-11-29 DIAGNOSIS — I252 Old myocardial infarction: Secondary | ICD-10-CM | POA: Diagnosis not present

## 2022-12-05 DIAGNOSIS — I10 Essential (primary) hypertension: Secondary | ICD-10-CM | POA: Diagnosis not present

## 2022-12-28 DIAGNOSIS — N5201 Erectile dysfunction due to arterial insufficiency: Secondary | ICD-10-CM | POA: Diagnosis not present

## 2023-02-28 DIAGNOSIS — H2513 Age-related nuclear cataract, bilateral: Secondary | ICD-10-CM | POA: Diagnosis not present

## 2023-02-28 DIAGNOSIS — H25011 Cortical age-related cataract, right eye: Secondary | ICD-10-CM | POA: Diagnosis not present

## 2023-04-05 ENCOUNTER — Ambulatory Visit: Payer: Medicare PPO | Attending: Cardiology | Admitting: Cardiology

## 2023-04-05 ENCOUNTER — Encounter: Payer: Self-pay | Admitting: Cardiology

## 2023-04-05 VITALS — BP 140/80 | HR 61 | Ht 72.0 in | Wt 253.0 lb

## 2023-04-05 DIAGNOSIS — I2510 Atherosclerotic heart disease of native coronary artery without angina pectoris: Secondary | ICD-10-CM | POA: Diagnosis not present

## 2023-04-05 DIAGNOSIS — I252 Old myocardial infarction: Secondary | ICD-10-CM

## 2023-04-05 DIAGNOSIS — I4510 Unspecified right bundle-branch block: Secondary | ICD-10-CM

## 2023-04-05 DIAGNOSIS — E782 Mixed hyperlipidemia: Secondary | ICD-10-CM

## 2023-04-05 NOTE — Patient Instructions (Signed)
Medication Instructions:  The current medical regimen is effective;  continue present plan and medications.  *If you need a refill on your cardiac medications before your next appointment, please call your pharmacy*  Follow-Up: At [facility], you and your health needs are our priority.  As part of our continuing mission to provide you with exceptional heart care, we have created designated Provider Care Teams.  These Care Teams include your primary Cardiologist (physician) and Advanced Practice Providers (APPs -  Physician Assistants and Nurse Practitioners) who all work together to provide you with the care you need, when you need it.  We recommend signing up for the patient portal called "MyChart".  Sign up information is provided on this After Visit Summary.  MyChart is used to connect with patients for Virtual Visits (Telemedicine).  Patients are able to view lab/test results, encounter notes, upcoming appointments, etc.  Non-urgent messages can be sent to your provider as well.   To learn more about what you can do with MyChart, go to [email or web address].    Your next appointment:   1 year(s)  Provider:   Mark Skains, MD      

## 2023-04-05 NOTE — Progress Notes (Signed)
Cardiology Office Note:    Date:  04/05/2023   ID:  Matthew Nunez, DOB 1946/05/13, MRN [id number]  PCP:  Daisy Floro, MD   [facility] Providers Cardiologist:  Donato Schultz, MD     Referring MD: Daisy Floro, MD    History of Present Illness:    Matthew Nunez is a 77 y.o. male here for follow-up of coronary artery disease.  Drug-eluting stent to RCA on 04/29/2018 in the setting of non-ST elevation myocardial infarction. The story was that he had GERD-like anginal symptoms the weekend of the [place] 2019.  He felt short of breath when walking his dog.  Troponin was drawn at the outpatient setting and was abnormal and he was sent to the emergency room.  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  His daughter is a Teacher, early years/pre in [place] at [place].  Has occasional bilateral lower extremity edema.  Donates blood regularly.  Still works part-time delivering flowers on Mondays and Fridays.  Had episode in 12/2022 less than 5 minutes, felt something. Took TUMS (none since 2019).   Past Medical History:  Diagnosis Date   Coronary artery disease    Dysthymia    Mobitz type 1 second degree AV block    history of    NSTEMI (non-ST elevated myocardial infarction)    04/30/18 PCI/DES to the mRCA, normal EF   Varicose veins of legs     Past Surgical History:  Procedure Laterality Date   CORONARY STENT INTERVENTION N/A 04/30/2018   Procedure: CORONARY STENT INTERVENTION;  Surgeon: Marykay Lex, MD;  Location: [facility] INVASIVE CV LAB;  Service: Cardiovascular;  Laterality: N/A;   HERNIA REPAIR     LEFT HEART CATH AND CORONARY ANGIOGRAPHY N/A 04/30/2018   Procedure: LEFT HEART CATH AND CORONARY ANGIOGRAPHY;  Surgeon: Marykay Lex, MD;  Location: [facility] INVASIVE CV LAB;  Service: Cardiovascular;  Laterality: N/A;   VERICOSE VEIN SURGERY  2000    Current Medications: Current Meds  Medication  Sig   aspirin EC 81 MG tablet Take 81 mg by mouth daily.   atorvastatin (LIPITOR) 80 MG tablet Take 1 tablet (80 mg total) by mouth daily at 6 PM.   Magnesium Hydroxide (MAGNESIA PO) Take 250 mg by mouth daily.   nitroGLYCERIN (NITROSTAT) 0.4 MG SL tablet PLACE 1 TABLET UNDER THE TONGUE EVERY 5 (FIVE) MINUTES X 3 DOSES AS NEEDED FOR CHEST PAIN.   valsartan (DIOVAN) 160 MG tablet Take 160 mg by mouth daily.     Allergies:   Patient has no known allergies.   Social History   Socioeconomic History   Marital status: Married    Spouse name: Not on file   Number of children: Not on file   Years of education: Not on file   Highest education level: Not on file  Occupational History   Not on file  Tobacco Use   Smoking status: Never   Smokeless tobacco: Never  Vaping Use   Vaping Use: Never used  Substance and Sexual Activity   Alcohol use: Yes    Comment: 2 DRINKS A WEEK   Drug use: No   Sexual activity: Yes    Comment: MARRIED  Other Topics Concern   Not on file  Social History Narrative   Not on file   Social Determinants of Health   Financial Resource Strain: Not on file  Food Insecurity: Not on file  Transportation Needs: Not on file  Physical Activity: Not on file  Stress: Not on file  Social Connections: Not on file     Family History: The patient's family history includes Alzheimer's disease in his father; Cirrhosis in his brother; Healthy in his sister.  ROS:   Please see the history of present illness.     All other systems reviewed and are negative.  EKGs/Labs/Other Studies Reviewed:    The following studies were reviewed today: cho 2018/05/25 Study Conclusions   - Left ventricle: The cavity size was normal. Wall thickness was    increased in a pattern of mild LVH. Systolic function was normal.    The estimated ejection fraction was in the range of 55% to 60%.    Wall motion was normal; there were no regional wall motion    abnormalities. Doppler  parameters are consistent with abnormal    left ventricular relaxation (grade 1 diastolic dysfunction). The    E/e&' ratio is between 8-15, suggesting indeterminate LV filling    pressure.  - Mitral valve: Calcified annulus. Mildly thickened leaflets .    There was mild regurgitation.  - Left atrium: The atrium was normal in size.  - Right atrium: The atrium was mildly dilated.  - Inferior vena cava: The vessel was dilated. The respirophasic    diameter changes were blunted (< 50%), consistent with elevated    central venous pressure.   Impressions:   - Compared to a prior study in 2017, there are few changes.    Cardiac catheterization 04/30/2018: Diagnostic Dominance: Right  Intervention         EKG:  04/05/23: NSR RBBB LAFB  Recent Labs: No results found for requested labs within last 365 days.  Recent Lipid Panel    Component Value Date/Time   CHOL 100 06/07/2018 0913   TRIG 47 06/07/2018 0913   HDL 43 06/07/2018 0913   CHOLHDL 2.3 06/07/2018 0913   CHOLHDL 3.1 05-25-2018 0226   VLDL 12 2018/05/25 0226   LDLCALC 48 06/07/2018 0913     Risk Assessment/Calculations:              Physical Exam:    VS:  BP (!) 140/80 (BP Location: Left Arm, Patient Position: Sitting, Cuff Size: Normal)   Pulse 61   Ht 6' (1.829 m)   Wt 253 lb (114.8 kg)   BMI 34.31 kg/m     Wt Readings from Last 3 Encounters:  04/05/23 253 lb (114.8 kg)  03/09/22 253 lb 3.2 oz (114.9 kg)  02/09/21 252 lb (114.3 kg)     GEN:  Well nourished, well developed in no acute distress HEENT: Normal NECK: No JVD; No carotid bruits LYMPHATICS: No lymphadenopathy CARDIAC: RRR, no murmurs, rubs, gallops RESPIRATORY:  Clear to auscultation without rales, wheezing or rhonchi  ABDOMEN: Soft, non-tender, non-distended MUSCULOSKELETAL: Left greater than right lower extremity edema; No deformity  SKIN: Warm and dry NEUROLOGIC:  Alert and oriented x 3 PSYCHIATRIC:  Normal affect   ASSESSMENT:     1. Coronary artery disease involving native coronary artery of native heart without angina pectoris   2. History of myocardial infarction   3. Right bundle branch block   4. Mixed hyperlipidemia    PLAN:    In order of problems listed above:  Coronary artery disease involving native coronary artery of native heart without angina pectoris Continue with goal-directed medical therapy.  On aspirin 81 mg.  On atorvastatin 80 mg a day.  LDL 52.  Continue with diet, exercise.  One isolated episode of chest discomfort.  No recurrence.  No exertional symptoms currently.  If symptoms do recur or become more worrisome, we will have low threshold for further cardiac testing.   History of myocardial infarction Prior RCA stent.  Cardiac catheterization reviewed.   Right bundle branch block Stable on EKG with first-degree AV block.   Hyperlipidemia Atorvastatin 80 mg.  Excellent LDL 52.  ALT 21.  Creatinine 0.9.   Elevated blood pressure reading He will continue to monitor at home periodically.  Appreciate Dr. Tenny Crawoss starting the valsartan.  Doing well.   Lower extremity edema Noted lower extremity edema left greater than right.  Dependent edema.  Compression stockings leg elevation.  Watch salt and fluids.  Weight loss.          Medication Adjustments/Labs and Tests Ordered: Current medicines are reviewed at length with the patient today.  Concerns regarding medicines are outlined above.  Orders Placed This Encounter  Procedures   EKG 12-Lead   No orders of the defined types were placed in this encounter.   Patient Instructions  Medication Instructions:  The current medical regimen is effective;  continue present plan and medications.  *If you need a refill on your cardiac medications before your next appointment, please call your pharmacy*  Follow-Up: At [facility], you and your health needs are our priority.  As part of our continuing mission to provide you with  exceptional heart care, we have created designated Provider Care Teams.  These Care Teams include your primary Cardiologist (physician) and Advanced Practice Providers (APPs -  Physician Assistants and Nurse Practitioners) who all work together to provide you with the care you need, when you need it.  We recommend signing up for the patient portal called "MyChart".  Sign up information is provided on this After Visit Summary.  MyChart is used to connect with patients for Virtual Visits (Telemedicine).  Patients are able to view lab/test results, encounter notes, upcoming appointments, etc.  Non-urgent messages can be sent to your provider as well.   To learn more about what you can do with MyChart, go to [email or web address].    Your next appointment:   1 year(s)  Provider:   Donato SchultzMark Kaniel Kiang, MD        Signed, Donato SchultzMark Reinhart Saulters, MD  04/05/2023 9:32 AM    [facility]

## 2023-06-07 DIAGNOSIS — H6123 Impacted cerumen, bilateral: Secondary | ICD-10-CM | POA: Diagnosis not present

## 2023-08-14 ENCOUNTER — Ambulatory Visit (INDEPENDENT_AMBULATORY_CARE_PROVIDER_SITE_OTHER): Payer: Medicare PPO | Admitting: Psychiatry

## 2023-08-14 DIAGNOSIS — F4323 Adjustment disorder with mixed anxiety and depressed mood: Secondary | ICD-10-CM | POA: Diagnosis not present

## 2023-08-14 NOTE — Progress Notes (Signed)
[facility] Counselor Initial Adult Exam  Name: Matthew Nunez Date: 08/14/2023 MRN: [id number] DOB: 1946/10/13 PCP: Daisy Floro, MD  Time spent: 58 minutes  Guardian/Payee:  Patient    Paperwork requested:  No   Reason for Visit /Presenting Problem: frustration, anxiety, difficulty with wife's challenges and uncertainties  Mental Status Exam:    Appearance:   Neat and Well Groomed     Behavior:  Appropriate, Sharing, and Motivated  Motor:  Normal  Speech/Language:   Clear and Coherent  Affect:  Anxious, frustration  Mood:  anxious and frustration  Thought process:  goal directed  Thought content:    WNL  Sensory/Perceptual disturbances:    WNL  Orientation:  oriented to person, place, time/date, situation, day of week, month of year, year, and stated date of Aug. 20, 2024  Attention:  Good  Concentration:  Good  Memory:  WNL  Fund of knowledge:   Good  Insight:    Good  Judgment:   Good  Impulse Control:  Good   Reported Symptoms:   see above sections  Risk Assessment: Danger to Self:  No Self-injurious Behavior: No Danger to Others: No Duty to Warn:no Physical Aggression / Violence:No  Access to Firearms a concern: No  Gang Involvement:No  Patient / guardian was educated about steps to take if suicide or homicide risk level increases between visits: Denies any SI or HI. While future psychiatric events cannot be accurately predicted, the patient does not currently require acute inpatient psychiatric care and does not currently meet [facility] involuntary commitment criteria.  Substance Abuse History: Current substance abuse: No     Past Psychiatric History:   No previous psychological problems have been observed Outpatient Providers:n/a History of Psych Hospitalization: No  Psychological Testing:  none    Abuse History: Victim of No.,  n/a    Report needed: No. Victim of Neglect:No. Perpetrator of  n/a   Witness / Exposure to Domestic  Violence: No   Protective Services Involvement: No  Witness to [employer] Violence:  No   Family History:  Correction- Mother (not Dad) had Alzheimers; both parents deceased Family History  Problem Relation Age of Onset   Alzheimer's disease Father    Healthy Sister    Cirrhosis Brother     Living situation: the patient lives with their spouse in their own home  Sexual Orientation:  Straight  Relationship Status: married for 51 yrs Name of spouse / other:n/a             If a parent, number of children / ages:2 children, daughter is 57 and son is 35 yrs old.   Daughter lives in [place], [place] and is a Teacher, early years/pre within hospital., is married and has 3 sons ages  Son lives in [place], married with 2 girls (ages 60 and 48).   Support Systems; spouse friends Adult Surveyor, mining Stress:  No   Income/Employment/Disability: Employment, Dance movement psychotherapist, and Occupational psychologist Service: No   Educational History: Education: some college  Religion/Sprituality/World View:   Protestant actively attends [employer]  Any cultural differences that may affect / interfere with treatment:  not applicable   Recreation/Hobbies: yard work, Multimedia programmer  Stressors:Other: issues with wife, "needing neuropsych eval."    Strengths:  Supportive Relationships, Family, Friends, Hopefulness, Journalist, newspaper, and Able to Communicate Effectively  Barriers:  "wife's decline"   Legal History: Pending legal issue / charges: The patient has no significant history of legal issues. History of legal issue /  charges:  none  Medical History/Surgical History:reviewed and patient confirms information. Past Medical History:  Diagnosis Date   Coronary artery disease    Dysthymia    Mobitz type 1 second degree AV block    history of    NSTEMI (non-ST elevated myocardial infarction) (HCC)    04/30/18 PCI/DES to the mRCA, normal EF   Varicose veins of legs     Past Surgical History:  Procedure  Laterality Date   CORONARY STENT INTERVENTION N/A 04/30/2018   Procedure: CORONARY STENT INTERVENTION;  Surgeon: Marykay Lex, MD;  Location: [facility] INVASIVE CV LAB;  Service: Cardiovascular;  Laterality: N/A;   HERNIA REPAIR     LEFT HEART CATH AND CORONARY ANGIOGRAPHY N/A 04/30/2018   Procedure: LEFT HEART CATH AND CORONARY ANGIOGRAPHY;  Surgeon: Marykay Lex, MD;  Location: [facility] INVASIVE CV LAB;  Service: Cardiovascular;  Laterality: N/A;   VERICOSE VEIN SURGERY  2000    Medications: Current Outpatient Medications  Medication Sig Dispense Refill   aspirin EC 81 MG tablet Take 81 mg by mouth daily.     atorvastatin (LIPITOR) 80 MG tablet Take 1 tablet (80 mg total) by mouth daily at 6 PM. 90 tablet 1   Magnesium Hydroxide (MAGNESIA PO) Take 250 mg by mouth daily.     nitroGLYCERIN (NITROSTAT) 0.4 MG SL tablet PLACE 1 TABLET UNDER THE TONGUE EVERY 5 (FIVE) MINUTES X 3 DOSES AS NEEDED FOR CHEST PAIN. 25 tablet 5   valsartan (DIOVAN) 160 MG tablet Take 160 mg by mouth daily.     No current facility-administered medications for this visit.    No Known Allergies  Diagnoses:    ICD-10-CM   1. Adjustment disorder with mixed anxiety and depressed mood  F43.23      Treatment goal plan of care: Worked with patient collaboratively on his treatment goal plan and he is in agreement with it.  Goals will remain on treatment plan as patient works with strategies in sessions and outside of sessions to meet his goals.  Progress is assessed each session and documented in the "plan" or "progress" sections of treatment note.  Reduce overall level, frequency, and intensity of the anxiety so that  daily functioning is not impaired. Increase understanding of beliefs and messages that produce the worry and anxiety.  Identify and use specific coping strategies for anxiety reduction.    Plan of Care:  This is the first appointment with this patient and today we collaboratively completed his initial  evaluation and initial treatment goal plan.  Matthew Nunez is a 77 year old, married for 65yrs to wife [name], with 2 adult children as noted above. 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 Enjoys neighborhood pool, golfing, and going to [employer]. Involved regularly in their church. No prior history re: behavioral health. Overall physical health good and sleeps well. Is here today because he is "concerned about how I should interact with my spouse as I observe her declining mental capabilities. He is with her every day and sees decline in terms of forgetfulness, severe mood swings, increased sleep, withdrawn from friends/church and less getting out. Does socialize some with others, some of which wife is involved but more rarely wife is involved. Patient feeling concerned, discouraged about wife's decline. They recently had a neurological exam scheduled for wife, which wife cancelled but they now have rescheduled for an appt within 2 months. Patient is very insightful, motivated, and realizing his desire and need to get support in  managing his own feelings and also in supporting his wife who he reports having developed some memory challenges and backing off more from interaction with others. He is hoping after wife's appt with neurologist within couple months, that wife may be willing to acknowledge and accept help for herself as well.  Goal review and progress/challenges noted with patient.  Next appointment within 3 weeks.   Mathis Fare, LCSW

## 2023-09-06 ENCOUNTER — Ambulatory Visit: Payer: Medicare PPO | Admitting: Psychiatry

## 2023-09-06 DIAGNOSIS — F4323 Adjustment disorder with mixed anxiety and depressed mood: Secondary | ICD-10-CM

## 2023-09-06 NOTE — Progress Notes (Signed)
      [facility] Counselor/Therapist Progress Note  Patient ID: Matthew Nunez, MRN: [id number],    Date: 09/06/2023  Time Spent: 53 minutes   Treatment Type: Individual Therapy  Reported Symptoms: frustration, decreased; anxiety about future and wife's challenges and uncertainties   Mental Status Exam:  Appearance:   Casual and Neat     Behavior:  Appropriate, Sharing, and Motivated  Motor:  Normal  Speech/Language:   Clear and Coherent  Affect:  anxious  Mood:  anxious  Thought process:  goal directed  Thought content:    WNL  Sensory/Perceptual disturbances:    WNL  Orientation:  oriented to person, place, time/date, situation, day of week, month of year, year, and stated date of Sept. 12, 2024  Attention:  Good  Concentration:  Good  Memory:  WNL  Fund of knowledge:   Good  Insight:    Good and Fair  Judgment:   Good  Impulse Control:  Good   Risk Assessment: Danger to Self:  No Self-injurious Behavior: No Danger to Others: No Duty to Warn:no Physical Aggression / Violence:No  Access to Firearms a concern: No  Gang Involvement:No   Subjective:   Patient and today following his initial evaluation from last session.  He reports continued anxiety and concerns especially regarding his wife's physical and emotional health and what he can best do to support. Reports wife not wanting him involved in her medical appts and affairs. Worked with patient on his anxiety and also ways he can interact and respond more effectively with his wife in ways that she may be more receptive and responsive. Able to more fully vent and share his concerns today. Wife does have appt coming up appt "about her memory issues" at [facility]. Patient is focusing on better self care, and working to stay more active for his own wellbeing and also help support his wife who he reports is showing some mental decline.  (Not all details included in this note due to patient privacy needs).  To return  after he and his wife's medical appointment on October 28, and will call if needed before that time.  Interventions: Cognitive Behavioral Therapy and Ego-Supportive  Reduce overall level, frequency, and intensity of the anxiety so that  daily functioning is not impaired. Increase understanding of beliefs and messages that produce the worry and anxiety.  Identify and use specific coping strategies for anxiety reduction.    Diagnosis:   ICD-10-CM   1. Adjustment disorder with mixed anxiety and depressed mood  F43.23      Plan:  Patient today working further on his treatment goals particularly his anxiety and concerns about his spouse whose health is declining.He is showing good effort and motivation.  Today was second session for patient.  He needs to continue his work with goal-directed behaviors in order to experience moving and a more positive and hopeful direction.   Goal review and progress/challenges noted with patient.  Next appointment scheduled after wife's neurological testing next month.  Patient will call before that time if needed.   Mathis Fare, LCSW

## 2023-09-11 DIAGNOSIS — L814 Other melanin hyperpigmentation: Secondary | ICD-10-CM | POA: Diagnosis not present

## 2023-09-11 DIAGNOSIS — Z85828 Personal history of other malignant neoplasm of skin: Secondary | ICD-10-CM | POA: Diagnosis not present

## 2023-09-11 DIAGNOSIS — L821 Other seborrheic keratosis: Secondary | ICD-10-CM | POA: Diagnosis not present

## 2023-09-11 DIAGNOSIS — L570 Actinic keratosis: Secondary | ICD-10-CM | POA: Diagnosis not present

## 2023-11-06 ENCOUNTER — Ambulatory Visit (INDEPENDENT_AMBULATORY_CARE_PROVIDER_SITE_OTHER): Payer: Medicare PPO | Admitting: Psychiatry

## 2023-11-06 DIAGNOSIS — F4323 Adjustment disorder with mixed anxiety and depressed mood: Secondary | ICD-10-CM

## 2023-11-06 NOTE — Progress Notes (Signed)
      [facility] Counselor/Therapist Progress Note  Patient ID: Matthew Nunez, MRN: [id number],    Date: 11/06/2023  Time Spent: 53 minutes   Treatment Type: Individual Therapy  Reported Symptoms: anxiety, concern for wife with some developing memory issues   Mental Status Exam:  Appearance:   Casual and Neat     Behavior:  Appropriate, Sharing, and Motivated  Motor:  Normal  Speech/Language:   Clear and Coherent  Affect:  Appropriate  Mood:  anxious  Thought process:  goal directed  Thought content:    WNL  Sensory/Perceptual disturbances:    WNL  Orientation:  oriented to person, place, time/date, situation, day of week, month of year, year, and stated date of Nov. 12, 2024  Attention:  Good  Concentration:  Good  Memory:  WNL  Fund of knowledge:   Good  Insight:    Good  Judgment:   Good  Impulse Control:  Good   Risk Assessment: Danger to Self:  No Self-injurious Behavior: No Danger to Others: No Duty to Warn:no Physical Aggression / Violence:No  Access to Firearms a concern: No  Gang Involvement:No   Subjective: Patient today talking through his thoughts and feelings in helping  support his wife of 52 years as she is experiencing more memory issues and husband is concerned. Continued anxiety but not excessive. "Wife  now allowing me to go with her to appts." Patient seemed to appreciate being able to talk through his wife's decline they as well as receive some suggestions regarding how he can be supportive of her and particularly in ways that might support the wife being more receptive and responsive to husband as he tries to support her.  Patient seen to appreciate being able to vent his concerns today and received support in the way in which she is trying to best manage their situation at home.  Encouraged him to continue focusing on his own self-care in addition to supporting his wife.  Interventions: Cognitive Behavioral Therapy and Ego-Supportive Reduce  overall level, frequency, and intensity of the anxiety so that  daily functioning is not impaired. Increase understanding of beliefs and messages that produce the worry and anxiety.  Identify and use specific coping strategies for anxiety reduction.   Diagnosis:   ICD-10-CM   1. Adjustment disorder with mixed anxiety and depressed mood  F43.23      Plan: Patient continues working on his treatment goals particularly and reducing the level and frequency of his anxiety so as to carry on his normal routine and daily functioning is much as possible.  Also appreciated being able to share his concerns about his wife's decline in the future.  He has shown progress and is continuing his work with goal-directed behaviors in order to move and a positive and more hopeful direction.  Goal review and progress/challenges noted with patient.  Next appointment   Mathis Fare, LCSW

## 2023-11-28 DIAGNOSIS — I10 Essential (primary) hypertension: Secondary | ICD-10-CM | POA: Diagnosis not present

## 2023-11-28 DIAGNOSIS — R252 Cramp and spasm: Secondary | ICD-10-CM | POA: Diagnosis not present

## 2023-11-28 DIAGNOSIS — Z0000 Encounter for general adult medical examination without abnormal findings: Secondary | ICD-10-CM | POA: Diagnosis not present

## 2023-11-28 DIAGNOSIS — Z125 Encounter for screening for malignant neoplasm of prostate: Secondary | ICD-10-CM | POA: Diagnosis not present

## 2023-11-28 DIAGNOSIS — I2510 Atherosclerotic heart disease of native coronary artery without angina pectoris: Secondary | ICD-10-CM | POA: Diagnosis not present

## 2023-12-05 DIAGNOSIS — I10 Essential (primary) hypertension: Secondary | ICD-10-CM | POA: Diagnosis not present

## 2023-12-05 DIAGNOSIS — G4733 Obstructive sleep apnea (adult) (pediatric): Secondary | ICD-10-CM | POA: Diagnosis not present

## 2023-12-13 DIAGNOSIS — N400 Enlarged prostate without lower urinary tract symptoms: Secondary | ICD-10-CM | POA: Diagnosis not present

## 2023-12-13 DIAGNOSIS — N5201 Erectile dysfunction due to arterial insufficiency: Secondary | ICD-10-CM | POA: Diagnosis not present

## 2023-12-13 DIAGNOSIS — R972 Elevated prostate specific antigen [PSA]: Secondary | ICD-10-CM | POA: Diagnosis not present

## 2024-03-19 DIAGNOSIS — H6123 Impacted cerumen, bilateral: Secondary | ICD-10-CM | POA: Diagnosis not present

## 2024-03-20 ENCOUNTER — Other Ambulatory Visit: Payer: Self-pay | Admitting: *Deleted

## 2024-03-20 ENCOUNTER — Ambulatory Visit (HOSPITAL_COMMUNITY)
Admission: RE | Admit: 2024-03-20 | Discharge: 2024-03-20 | Disposition: A | Discharge status: Home / Self Care | Payer: Medicare PPO | Source: Ambulatory Visit | Attending: Vascular Surgery | Admitting: Vascular Surgery

## 2024-03-20 ENCOUNTER — Ambulatory Visit: Payer: Medicare PPO | Admitting: Physician Assistant

## 2024-03-20 ENCOUNTER — Encounter: Payer: Self-pay | Admitting: Physician Assistant

## 2024-03-20 VITALS — BP 145/74 | HR 69 | Temp 98.3°F | Ht 72.0 in | Wt 253.8 lb

## 2024-03-20 DIAGNOSIS — R600 Localized edema: Secondary | ICD-10-CM

## 2024-03-20 NOTE — Progress Notes (Signed)
 [facility]           OF [place]  History and Physical   Matthew Nunez is a 78 y.o. male who presents with BLE swelling with hx of right leg vein stripping by Dr. Arbie Cookey in 2000.  He has not had any interventions on the left leg.    He has never had a DVT.  He does not really have any skin color changes on his lower legs.  His sister has hx of spider veins but otherwise, no family hx of varicose veins.  He has worn compression in the past but currently does not wear them.  He states that if he wears dress socks, he will get some indentions on his skin at the top of the socks.  He does not really have significant swelling that is bothersome at this time.    Pt is followed by Dr. Anne Fu for CAD with hx of drug eluting stent to RCA 04/29/2018 in setting of NSTEMI.  The pt is  on a statin for cholesterol management.  The pt is on a daily aspirin.   Other AC:  none The pt is on ARB for hypertension.   The pt is not on medication for diabetes.   Tobacco hx:  never  Pt does not have family hx of AAA.  Past Medical History:  Diagnosis Date   Coronary artery disease    Dysthymia    Mobitz type 1 second degree AV block    history of    NSTEMI (non-ST elevated myocardial infarction) (HCC)    04/30/18 PCI/DES to the mRCA, normal EF   Varicose veins of legs     Past Surgical History:  Procedure Laterality Date   CORONARY STENT INTERVENTION N/A 04/30/2018   Procedure: CORONARY STENT INTERVENTION;  Surgeon: Marykay Lex, MD;  Location: [facility] INVASIVE CV LAB;  Service: Cardiovascular;  Laterality: N/A;   HERNIA REPAIR     LEFT HEART CATH AND CORONARY ANGIOGRAPHY N/A 04/30/2018   Procedure: LEFT HEART CATH AND CORONARY ANGIOGRAPHY;  Surgeon: Marykay Lex, MD;  Location: [facility] INVASIVE CV LAB;  Service: Cardiovascular;  Laterality: N/A;   VERICOSE VEIN SURGERY  2000    Social History   Socioeconomic History   Marital status: Married    Spouse name: Not on  file   Number of children: Not on file   Years of education: Not on file   Highest education level: Not on file  Occupational History   Not on file  Tobacco Use   Smoking status: Never   Smokeless tobacco: Never  Vaping Use   Vaping status: Never Used  Substance and Sexual Activity   Alcohol use: Yes    Comment: 2 DRINKS A WEEK   Drug use: No   Sexual activity: Yes    Comment: MARRIED  Other Topics Concern   Not on file  Social History Narrative   Not on file   Social Drivers of Health   Financial Resource Strain: Not on file  Food Insecurity: Not on file  Transportation Needs: Not on file  Physical Activity: Not on file  Stress: Not on file  Social Connections: Not on file  Intimate Partner Violence: Not on file    Family History  Problem Relation Age of Onset   Alzheimer's disease Father    Healthy Sister    Cirrhosis Brother     Current Outpatient Medications  Medication Sig Dispense Refill  aspirin EC 81 MG tablet Take 81 mg by mouth daily.     atorvastatin (LIPITOR) 80 MG tablet Take 1 tablet (80 mg total) by mouth daily at 6 PM. 90 tablet 1   Magnesium Hydroxide (MAGNESIA PO) Take 250 mg by mouth daily.     nitroGLYCERIN (NITROSTAT) 0.4 MG SL tablet PLACE 1 TABLET UNDER THE TONGUE EVERY 5 (FIVE) MINUTES X 3 DOSES AS NEEDED FOR CHEST PAIN. 25 tablet 5   valsartan (DIOVAN) 160 MG tablet Take 160 mg by mouth daily.     No current facility-administered medications for this visit.    No Known Allergies  REVIEW OF SYSTEMS:   [X]  denotes positive finding, [ ]  denotes negative finding Cardiac  Comments:  Chest pain or chest pressure:    Shortness of breath upon exertion:    Short of breath when lying flat:    Irregular heart rhythm:        Vascular    Pain in calf, thigh, or hip brought on by ambulation:    Pain in feet at night that wakes you up from your sleep:     Blood clot in your veins:    Leg swelling:  x See HPI      Pulmonary    Oxygen at  home:    Productive cough:     Wheezing:         Neurologic    Sudden weakness in arms or legs:     Sudden numbness in arms or legs:     Sudden onset of difficulty speaking or slurred speech:    Temporary loss of vision in one eye:     Problems with dizziness:         Gastrointestinal    Blood in stool:     Vomited blood:         Genitourinary    Burning when urinating:     Blood in urine:        Psychiatric    Major depression:         Hematologic    Bleeding problems:    Problems with blood clotting too easily:        Skin    Rashes or ulcers:        Constitutional    Fever or chills:      PHYSICAL EXAMINATION:  Today's Vitals   03/20/24 1455  BP: (!) 145/74  Pulse: 69  Temp: 98.3 F (36.8 C)  TempSrc: Temporal  SpO2: 96%  Weight: 253 lb 12.8 oz (115.1 kg)  Height: 6' (1.829 m)   Body mass index is 34.42 kg/m.   General:  WDWN in NAD; vital signs documented above Gait: Not observed HENT: WNL, normocephalic Pulmonary: normal non-labored breathing without wheezing Cardiac: regular HR; without carotid bruits Skin: without rashes Vascular Exam/Pulses:  Right Left  Radial 2+ (normal) 2+ (normal)  DP 2+ (normal) 2+ (normal)   Extremities: significant varicosities left thigh and lower leg.  Mild swelling BLE  Neurologic: A&O X 3;  moving all extremities equally Psychiatric:  The pt has Normal affect.   Non-Invasive Vascular Imaging:   Venous duplex on 03/20/2024: Venous Reflux Times  +------------------+---------+------+-----------+------------+--------+  RIGHT            Reflux NoRefluxReflux TimeDiameter cmsComments                              Yes                                   +------------------+---------+------+-----------+------------+--------+  CFV                        yes   >1 second                       +------------------+---------+------+-----------+------------+--------+  FV mid            no                                               +------------------+---------+------+-----------+------------+--------+  Popliteal                  yes   >1 second                       +------------------+---------+------+-----------+------------+--------+  GSV at SFJ                  yes    >500 ms      0.52              +------------------+---------+------+-----------+------------+--------+  GSV prox thigh              yes    >500 ms      0.47              +------------------+---------+------+-----------+------------+--------+  GSV mid thigh               yes    >500 ms      0.4               +------------------+---------+------+-----------+------------+--------+  GSV dist thigh              yes    >500 ms      0.26              +------------------+---------+------+-----------+------------+--------+  GSV at knee                 yes    >500 ms      0.31              +------------------+---------+------+-----------+------------+--------+  GSV prox calf               yes    >500 ms      0.6               +------------------+---------+------+-----------+------------+--------+  SSV Pop Fossa     no                            0.24              +------------------+---------+------+-----------+------------+--------+  SSV prox calf     no                            0.2               +------------------+---------+------+-----------+------------+--------+  SSV mid calf      no                            0.2               +------------------+---------+------+-----------+------------+--------+  AASV prox  yes    >500 ms      0.57              +------------------+---------+------+-----------+------------+--------+  AASV anterior prox          yes    >500 ms      0.51              +------------------+---------+------+-----------+------------+--------+  AASV anterior mid           yes    >500 ms                         +------------------+---------+------+-----------+------------+--------+  AASV medial prox            yes    >500 ms      0.54              +------------------+---------+------+-----------+------------+--------+  AASV medial dist            yes    >500 ms      0.75              +------------------+---------+------+-----------+------------+--------+     Summary:  Right:  - No evidence of deep vein thrombosis seen in the right lower extremity,  from the common femoral through the popliteal veins.  - No evidence of superficial venous thrombosis in the right lower  extremity.  -Deep vein reflux in the CFV and popliteal vein.  - Superficial vein reflux in the SFJ, GSV, and AASV in both anterior and  medial branches.     Matthew Nunez is a 78 y.o. male who presents with: BLE swelling    -pt has palpable DP pedal pulses bilaterally -pt does not have evidence of DVT.  Pt does have venous reflux in the right deep venous system as well as the GSV at the SFJ, proximal thigh throughout to the proximal calf as well as the anterior accessory vein.  Measures from 0.2cm -0.75cm.  He does not really have any issues from this except for some mild swelling with some mild pitting edema with dress socks.   -given he does not have significant issues, would not recommend laser ablation unless he starts to have more swelling or other symptoms and therefore, discussed with pt about wearing knee high 15-20 mmHg compression stockings  -discussed the importance of leg elevation and how to elevate properly - pt is advised to elevate their legs and a diagram is given to them to demonstrate for pt to lay flat on their back with knees elevated and slightly bent with their feet higher than their knees, which puts their feet higher than their heart for 15 minutes per day.  If pt cannot lay flat, advised to lay as flat as possible.  -pt is advised to continue as much walking as possible and avoid  sitting or standing for long periods of time.  -discussed importance of weight loss and exercise and that water aerobics would also be beneficial.  -handout with recommendations given -he does have some small spider veins around his ankles.  These have never bled but I did discuss how to elevate legs and hold pressure for 15-20 minutes should they ever bleed.   -pt will f/u as needed   Doreatha Massed, [facility] [phone]  Clinic MD:  Karin Lieu

## 2024-04-01 ENCOUNTER — Ambulatory Visit: Payer: Medicare PPO | Attending: Cardiology | Admitting: Cardiology

## 2024-04-01 ENCOUNTER — Encounter: Payer: Self-pay | Admitting: Cardiology

## 2024-04-01 VITALS — BP 128/72 | HR 63 | Ht 72.0 in | Wt 252.6 lb

## 2024-04-01 DIAGNOSIS — I2510 Atherosclerotic heart disease of native coronary artery without angina pectoris: Secondary | ICD-10-CM

## 2024-04-01 DIAGNOSIS — I4510 Unspecified right bundle-branch block: Secondary | ICD-10-CM | POA: Diagnosis not present

## 2024-04-01 DIAGNOSIS — R600 Localized edema: Secondary | ICD-10-CM

## 2024-04-01 DIAGNOSIS — I252 Old myocardial infarction: Secondary | ICD-10-CM | POA: Diagnosis not present

## 2024-04-01 MED ORDER — NITROGLYCERIN 0.4 MG SL SUBL: SUBLINGUAL_TABLET | SUBLINGUAL | 3 refills | Status: AC

## 2024-04-01 NOTE — Patient Instructions (Signed)

## 2024-04-01 NOTE — Progress Notes (Signed)
 Cardiology Office Note:  .   Date:  04/01/2024  ID:  Matthew Nunez, DOB 10/02/1946, MRN [id number] PCP: Daisy Floro, MD  [facility] Providers Cardiologist:  Donato Schultz, MD     History of Present Illness: .   Matthew Nunez is a 78 y.o. male Discussed the use of AI scribe software for clinical note transcription with the patient, who gave verbal consent to proceed.  History of Present Illness Matthew Nunez "[name]" is a 78 year old male with coronary artery disease who presents for follow-up after stenting of the RCA.  He had drug-eluting stents placed in the right coronary artery (RCA) during catheterization on Apr 29, 2018, following a non-ST elevation myocardial infarction. He experienced GERD-like anginal symptoms and shortness of breath while walking his dog during the weekend of the [place] in 2019. An outpatient troponin test was abnormal, leading to his emergency room visit.  He reports occasional lower extremity edema. No current chest pain, heartburn-type pain, or syncope. He recalls one episode of heartburn in the past year, which resolved without the need for nitroglycerin. He lost his nitroglycerin bottle and has since received a new prescription. His echocardiogram from 2019 showed an ejection fraction of 60% with grade one diastolic dysfunction and a mildly dilated right atrium. A prior EKG showed right bundle branch block and left anterior fascicular block, which remain unchanged. He is currently taking aspirin 81 mg, atorvastatin 80 mg, and valsartan 160 mg daily. His LDL has been stable at 52, and his kidney function is normal with a creatinine level of 0.82.  He maintains an active lifestyle, delivering flowers part-time and walking regularly. He is a retired Psychologist, occupational and previously worked part-time at an [employer] before the [employer] pandemic. His daughter is a Teacher, early years/pre at [place], [place]. He has a history of donating blood and enjoys  staying active in the community.     Studies Reviewed: Marland Kitchen   EKG Interpretation Date/Time:  Tuesday April 01 2024 09:50:13 EDT Ventricular Rate:  63 PR Interval:  200 QRS Duration:  152 QT Interval:  426 QTC Calculation: 435 R Axis:   -84  Text Interpretation: Normal sinus rhythm Right bundle branch block Left anterior fascicular block Bifascicular block Inferior infarct , age undetermined When compared with ECG of 01-May-2018 02:36, Left anterior fascicular block is now Present Confirmed by Donato Schultz ([id number]) on 04/01/2024 9:52:09 AM    Results LABS LDL: 59 Creatinine: 0.82  DIAGNOSTIC Echocardiogram: Ejection fraction 60%, grade 1 diastolic dysfunction, mildly dilated right atrium (04/2018) Cardiac catheterization: 99% mid right coronary artery stenosis, successfully stented, left to right collaterals via septal branch (04/30/2018) EKG: Right bundle branch block, left anterior fascicular block (most recent: 04/01/2024) Risk Assessment/Calculations:            Physical Exam:   VS:  BP 128/72   Pulse 63   Ht 6' (1.829 m)   Wt 252 lb 9.6 oz (114.6 kg)   SpO2 95%   BMI 34.26 kg/m    Wt Readings from Last 3 Encounters:  04/01/24 252 lb 9.6 oz (114.6 kg)  03/20/24 253 lb 12.8 oz (115.1 kg)  04/05/23 253 lb (114.8 kg)    GEN: Well nourished, well developed in no acute distress NECK: No JVD; No carotid bruits CARDIAC: RRR, no murmurs, no rubs, no gallops RESPIRATORY:  Clear to auscultation without rales, wheezing or rhonchi  ABDOMEN: Soft, non-tender, non-distended EXTREMITIES:  Minimal edema, varicose veins; No deformity  ASSESSMENT AND PLAN: .    Assessment and Plan Assessment & Plan Coronary artery disease Coronary artery disease with drug-eluting stent in the right coronary artery post-2019 non-ST elevation myocardial infarction. No current chest pain or anginal symptoms. LDL at 59 mg/dL and blood pressure are well-controlled. On aspirin, atorvastatin, and  valsartan. Recent EKG shows no new changes. Kidney function is normal at 0.82 mg/dL. Emphasized Mediterranean diet and regular physical activity for cardiovascular health. - Continue aspirin 81 mg oral daily - Continue atorvastatin 80 mg oral daily - Continue valsartan 160 mg oral daily - Refill nitroglycerin prescription - Encourage adherence to a Mediterranean diet - Advise 30 minutes of daily walking - Monitor for recurrent symptoms and contact if discomfort occurs  Right bundle branch block Right bundle branch block, unchanged from previous evaluations. Recent EKG shows no new changes.  Lower extremity edema Reports occasional lower extremity edema.  Follow-up Maintain current regimen and lifestyle modifications. Stay active and adhere to dietary recommendations. - Provide summary page with current health status and recommendations - Schedule follow-up appointment as needed           Signed, Donato Schultz, MD

## 2024-04-12 DIAGNOSIS — R059 Cough, unspecified: Secondary | ICD-10-CM | POA: Diagnosis not present

## 2024-04-12 DIAGNOSIS — J019 Acute sinusitis, unspecified: Secondary | ICD-10-CM | POA: Diagnosis not present

## 2024-09-04 DIAGNOSIS — G4733 Obstructive sleep apnea (adult) (pediatric): Secondary | ICD-10-CM | POA: Diagnosis not present

## 2024-09-10 DIAGNOSIS — L821 Other seborrheic keratosis: Secondary | ICD-10-CM | POA: Diagnosis not present

## 2024-09-10 DIAGNOSIS — L570 Actinic keratosis: Secondary | ICD-10-CM | POA: Diagnosis not present

## 2024-09-10 DIAGNOSIS — D692 Other nonthrombocytopenic purpura: Secondary | ICD-10-CM | POA: Diagnosis not present

## 2024-09-10 DIAGNOSIS — Z85828 Personal history of other malignant neoplasm of skin: Secondary | ICD-10-CM | POA: Diagnosis not present

## 2024-09-10 DIAGNOSIS — L565 Disseminated superficial actinic porokeratosis (DSAP): Secondary | ICD-10-CM | POA: Diagnosis not present

## 2024-09-10 DIAGNOSIS — L905 Scar conditions and fibrosis of skin: Secondary | ICD-10-CM | POA: Diagnosis not present

## 2024-09-17 DIAGNOSIS — H6123 Impacted cerumen, bilateral: Secondary | ICD-10-CM | POA: Diagnosis not present

## 2024-09-26 DIAGNOSIS — H2513 Age-related nuclear cataract, bilateral: Secondary | ICD-10-CM | POA: Diagnosis not present

## 2024-09-26 DIAGNOSIS — H52203 Unspecified astigmatism, bilateral: Secondary | ICD-10-CM | POA: Diagnosis not present

## 2024-09-26 DIAGNOSIS — H524 Presbyopia: Secondary | ICD-10-CM | POA: Diagnosis not present

## 2025-04-27 ENCOUNTER — Ambulatory Visit: Admitting: Cardiology
